# Patient Record
Sex: Male | Born: 1960 | Race: White | Hispanic: No | Marital: Married | Smoking: Never smoker
Health system: Southern US, Community
[De-identification: ages and names within clinical notes are randomized; demographics above are authoritative.]

## PROBLEM LIST (undated history)

## (undated) DIAGNOSIS — M109 Gout, unspecified: Secondary | ICD-10-CM

## (undated) DIAGNOSIS — M1990 Unspecified osteoarthritis, unspecified site: Secondary | ICD-10-CM

## (undated) DIAGNOSIS — I10 Essential (primary) hypertension: Secondary | ICD-10-CM

## (undated) HISTORY — PX: JOINT REPLACEMENT: SHX530

## (undated) HISTORY — PX: HERNIA REPAIR: SHX51

## (undated) HISTORY — PX: KNEE ARTHROSCOPY: SHX127

## (undated) HISTORY — PX: WISDOM TOOTH EXTRACTION: SHX21

---

## 2017-07-09 ENCOUNTER — Emergency Department (HOSPITAL_BASED_OUTPATIENT_CLINIC_OR_DEPARTMENT_OTHER)
Admission: EM | Admit: 2017-07-09 | Discharge: 2017-07-09 | Disposition: A | Discharge status: Home / Self Care | Payer: Managed Care, Other (non HMO) | Attending: Emergency Medicine | Admitting: Emergency Medicine

## 2017-07-09 ENCOUNTER — Emergency Department (HOSPITAL_BASED_OUTPATIENT_CLINIC_OR_DEPARTMENT_OTHER): Payer: Managed Care, Other (non HMO)

## 2017-07-09 ENCOUNTER — Encounter (HOSPITAL_BASED_OUTPATIENT_CLINIC_OR_DEPARTMENT_OTHER): Payer: Self-pay | Admitting: *Deleted

## 2017-07-09 ENCOUNTER — Other Ambulatory Visit: Payer: Self-pay

## 2017-07-09 DIAGNOSIS — S62652A Nondisplaced fracture of medial phalanx of right middle finger, initial encounter for closed fracture: Secondary | ICD-10-CM | POA: Insufficient documentation

## 2017-07-09 DIAGNOSIS — I10 Essential (primary) hypertension: Secondary | ICD-10-CM | POA: Insufficient documentation

## 2017-07-09 DIAGNOSIS — Y9389 Activity, other specified: Secondary | ICD-10-CM | POA: Insufficient documentation

## 2017-07-09 DIAGNOSIS — Y998 Other external cause status: Secondary | ICD-10-CM | POA: Diagnosis not present

## 2017-07-09 DIAGNOSIS — X509XXA Other and unspecified overexertion or strenuous movements or postures, initial encounter: Secondary | ICD-10-CM | POA: Insufficient documentation

## 2017-07-09 DIAGNOSIS — Z79899 Other long term (current) drug therapy: Secondary | ICD-10-CM | POA: Insufficient documentation

## 2017-07-09 DIAGNOSIS — S62654A Nondisplaced fracture of medial phalanx of right ring finger, initial encounter for closed fracture: Secondary | ICD-10-CM | POA: Insufficient documentation

## 2017-07-09 DIAGNOSIS — S6991XA Unspecified injury of right wrist, hand and finger(s), initial encounter: Secondary | ICD-10-CM | POA: Diagnosis present

## 2017-07-09 DIAGNOSIS — Y929 Unspecified place or not applicable: Secondary | ICD-10-CM | POA: Diagnosis not present

## 2017-07-09 HISTORY — DX: Gout, unspecified: M10.9

## 2017-07-09 HISTORY — DX: Essential (primary) hypertension: I10

## 2017-07-09 NOTE — ED Provider Notes (Signed)
[facility] EMERGENCY DEPARTMENT Provider Note   CSN: [id number] Arrival date & time: 07/09/17  4098     History   Chief Complaint Chief Complaint  Patient presents with  . Hand Injury    HPI Alan Carr is a 57 y.o. male.  Chief complaint is right hand injury.  HPI 57 year old male.  Right-handed.  He was attempting to stop his dog from chasing a child yesterday.  The dog was running at him.  Patient reached down and grabbed the dog's collar from the front with his right third and fourth digits.  They were hyperextended and "twisted".  He has some swelling and ecchymosis over his PIP joints of his middle and ring finger today.  Painful and swollen.  Past Medical History:  Diagnosis Date  . Gout   . Hypertension     There are no active problems to display for this patient.   Past Surgical History:  Procedure Laterality Date  . HERNIA REPAIR    . JOINT REPLACEMENT         Home Medications    Prior to Admission medications   Medication Sig Start Date End Date Taking? Authorizing Provider  allopurinol (ZYLOPRIM) 150 mg TABS tablet Take 300 mg by mouth daily.   Yes [provider]  atorvastatin (LIPITOR) 40 MG tablet Take 40 mg by mouth daily.   Yes [provider]  lisinopril (PRINIVIL,ZESTRIL) 20 MG tablet Take 20 mg by mouth daily.   Yes [provider]    Family History History reviewed. No pertinent family history.  Social History Social History   Tobacco Use  . Smoking status: Never Smoker  . Smokeless tobacco: Never Used  Substance Use Topics  . Alcohol use: No    Frequency: Never  . Drug use: No     Allergies   Aspirin   Review of Systems Review of Systems  Musculoskeletal:       Right hand pain.  Swelling and ecchymosis of right fourth and third digits.  No wrist pain.  Review of systems is otherwise complete, and negative   Physical Exam Updated Vital Signs BP (!) 182/102 (BP Location: Left Arm)    Pulse 74   Temp 98.3 F (36.8 C) (Oral)   Resp 16   Ht 5\' 8"  (1.727 m)   Wt 83.9 kg (185 lb)   SpO2 98%   BMI 28.13 kg/m   Physical Exam  Constitutional: He appears well-developed and well-nourished.  HENT:  Head: Atraumatic.  Musculoskeletal:  Tenderness with ecchymosis and soft tissue swelling of the right hand third and fourth digit dorsal over PIP.  He can flex although not completely due to swelling and discomfort.  No malrotation.  He can fully extend.  No limitation of tendon function.  Normal sensation.     ED Treatments / Results  Labs (all labs ordered are listed, but only abnormal results are displayed) Labs Reviewed - No data to display  EKG  EKG Interpretation None       Radiology Dg Hand Complete Right  Result Date: 07/09/2017 CLINICAL DATA:  57 year-old male was holding onto a dog by the collar last night when the dog pulled away twisting his hand. C/O pain to 3rd and 4th digits EXAM: RIGHT HAND - COMPLETE 3+ VIEW COMPARISON:  None. FINDINGS: Oblique nondisplaced fracture of midportion of the third middle phalanx. Nondisplaced transverse fracture of the distal shaft of the fourth middle phalanx. No other fracture or dislocation.  No aggressive  osseous lesion. IMPRESSION: Acute oblique nondisplaced fracture of the midportion of the third middle phalanx. Acute nondisplaced transverse fracture of the distal shaft of the fourth middle phalanx. Electronically Signed   By: Elige KoHetal  Patel   On: 07/09/2017 10:03    Procedures Procedures (including critical care time)  Medications Ordered in ED Medications - No data to display   Initial Impression / Assessment and Plan / ED Course  I have reviewed the triage vital signs and the nursing notes.  Pertinent labs & imaging results that were available during my care of the patient were reviewed by me and considered in my medical decision making (see chart for details).   X-ray pending.  10:10: Straight reviewed.  He  has fractures of third, and fourth digit middle phalanx.  The fourth is nondisplaced.  The middle finger on x-ray suggests slight rotational deformity.  However when patient is taken through range of motion finger does not rotate into malposition.  Placed in ulnar gutter splint in the safe position.  Will discharge to follow-up with Dr. Mina MarbleWeingold of hand surgery.  Ice, Motrin, Tylenol for pain.  Final Clinical Impressions(s) / ED Diagnoses   Final diagnoses:  Closed nondisplaced fracture of middle phalanx of right ring finger, initial encounter  Closed nondisplaced fracture of middle phalanx of right middle finger, initial encounter    ED Discharge Orders    None       Rolland PorterJames, Davone Shinault, MD 07/09/17 1017

## 2017-07-09 NOTE — ED Notes (Signed)
Patient transported to X-ray 

## 2017-07-09 NOTE — ED Notes (Signed)
ED Provider at bedside. 

## 2017-07-09 NOTE — Discharge Instructions (Signed)
Call Dr. Mina MarbleWeingold (Hand Surgeon) for outpatient follow-up. Where the splint until seen in follow up evaluation.

## 2017-07-09 NOTE — ED Triage Notes (Signed)
Pt c/o right hand injury x 1 day ago

## 2020-10-06 ENCOUNTER — Other Ambulatory Visit: Payer: Self-pay

## 2020-10-06 ENCOUNTER — Emergency Department (HOSPITAL_BASED_OUTPATIENT_CLINIC_OR_DEPARTMENT_OTHER): Payer: BC Managed Care – PPO

## 2020-10-06 ENCOUNTER — Encounter (HOSPITAL_BASED_OUTPATIENT_CLINIC_OR_DEPARTMENT_OTHER): Payer: Self-pay | Admitting: *Deleted

## 2020-10-06 ENCOUNTER — Emergency Department (HOSPITAL_BASED_OUTPATIENT_CLINIC_OR_DEPARTMENT_OTHER)
Admission: EM | Admit: 2020-10-06 | Discharge: 2020-10-06 | Disposition: A | Discharge status: Home / Self Care | Payer: BC Managed Care – PPO | Attending: Emergency Medicine | Admitting: Emergency Medicine

## 2020-10-06 DIAGNOSIS — S99912A Unspecified injury of left ankle, initial encounter: Secondary | ICD-10-CM | POA: Diagnosis present

## 2020-10-06 DIAGNOSIS — Z79899 Other long term (current) drug therapy: Secondary | ICD-10-CM | POA: Insufficient documentation

## 2020-10-06 DIAGNOSIS — Z9660 Presence of unspecified orthopedic joint implant: Secondary | ICD-10-CM | POA: Insufficient documentation

## 2020-10-06 DIAGNOSIS — W109XXA Fall (on) (from) unspecified stairs and steps, initial encounter: Secondary | ICD-10-CM | POA: Diagnosis not present

## 2020-10-06 DIAGNOSIS — S93492A Sprain of other ligament of left ankle, initial encounter: Secondary | ICD-10-CM

## 2020-10-06 DIAGNOSIS — I10 Essential (primary) hypertension: Secondary | ICD-10-CM | POA: Insufficient documentation

## 2020-10-06 DIAGNOSIS — S8391XA Sprain of unspecified site of right knee, initial encounter: Secondary | ICD-10-CM | POA: Insufficient documentation

## 2020-10-06 DIAGNOSIS — Y92009 Unspecified place in unspecified non-institutional (private) residence as the place of occurrence of the external cause: Secondary | ICD-10-CM | POA: Diagnosis not present

## 2020-10-06 DIAGNOSIS — Y9301 Activity, walking, marching and hiking: Secondary | ICD-10-CM | POA: Diagnosis not present

## 2020-10-06 DIAGNOSIS — R52 Pain, unspecified: Secondary | ICD-10-CM

## 2020-10-06 NOTE — ED Triage Notes (Signed)
Missed step last pm and rolled left ankle,bruising and swelling to foot,  fell, injured rt knee  Heard pop in knee

## 2020-10-06 NOTE — ED Provider Notes (Signed)
[facility] EMERGENCY DEPARTMENT Provider Note   CSN: [id number] Arrival date & time: 10/06/20  4975     History Chief Complaint  Patient presents with  . Fall    Alan Carr is a 60 y.o. male.  60 year old male with history of hypertension and gout who presents with fall and left ankle injury.  Last night, the patient was walking down his steps at home when he missed the last step and rolled his left ankle, falling and also injuring his right knee.  He thinks he heard a pop in his right knee.  He has been able to ambulate but with difficulty.  He has noted some swelling and bruising on the top of his foot.  No numbness.  No head injury or loss of consciousness.  No anticoagulant use.  The history is provided by the patient.  Fall       Past Medical History:  Diagnosis Date  . Gout   . Hypertension     There are no problems to display for this patient.   Past Surgical History:  Procedure Laterality Date  . HERNIA REPAIR    . JOINT REPLACEMENT         No family history on file.  Social History   Tobacco Use  . Smoking status: Never Smoker  . Smokeless tobacco: Never Used  Substance Use Topics  . Alcohol use: No  . Drug use: No    Home Medications Prior to Admission medications   Medication Sig Start Date End Date Taking? Authorizing Provider  allopurinol (ZYLOPRIM) 150 mg TABS tablet Take 300 mg by mouth daily.    [provider]  atorvastatin (LIPITOR) 40 MG tablet Take 40 mg by mouth daily.    [provider]  lisinopril (PRINIVIL,ZESTRIL) 20 MG tablet Take 20 mg by mouth daily.    [provider]    Allergies    Aspirin and Ibuprofen  Review of Systems   Review of Systems  Musculoskeletal: Positive for joint swelling.  Skin: Positive for color change.  Neurological: Negative for numbness.    Physical Exam Updated Vital Signs BP 125/84 (BP Location: Right Arm)   Pulse 72   Temp 98.4 F (36.9 C) (Oral)    Resp 18   Ht 5\' 8"  (1.727 m)   Wt 81.6 kg   SpO2 96%   BMI 27.37 kg/m   Physical Exam Vitals and nursing note reviewed.  Constitutional:      General: He is not in acute distress.    Appearance: He is well-developed.  HENT:     Head: Normocephalic and atraumatic.  Eyes:     Conjunctiva/sclera: Conjunctivae normal.  Cardiovascular:     Pulses: Normal pulses.  Musculoskeletal:        General: Swelling present.     Cervical back: Neck supple.     Comments: Mild edema and ecchymosis on lateral dorsal L foot w/ mild tenderness along ATFL; no gross deformity; no base of 5th metatarsal tenderness or midfoot instability; R knee with normal ROM, no obvious effusion  Skin:    General: Skin is warm and dry.  Neurological:     Mental Status: He is alert and oriented to person, place, and time.  Psychiatric:        Judgment: Judgment normal.     ED Results / Procedures / Treatments   Labs (all labs ordered are listed, but only abnormal results are displayed) Labs Reviewed - No data to display  EKG  None  Radiology DG Ankle Complete Left  Result Date: 10/06/2020 CLINICAL DATA:  Twisting injury 1 day ago with ankle pain, initial encounter EXAM: LEFT ANKLE COMPLETE - 3+ VIEW COMPARISON:  None. FINDINGS: No acute fracture or dislocation is noted. Multiple well corticated bony densities are noted adjacent to the medial malleolus consistent with prior trauma and nonunion. Similar findings are noted along the dorsal aspect of the tarsal navicular bone and talus. Small calcaneal spurs are noted. No other focal abnormality is seen. IMPRESSION: Changes of prior trauma with nonunion. No acute abnormality is noted. Electronically Signed   By: Alcide Clever M.D.   On: 10/06/2020 09:00   DG Knee Complete 4 Views Right  Result Date: 10/06/2020 CLINICAL DATA:  Twisting injury 1 day ago with knee pain, initial encounter EXAM: RIGHT KNEE - COMPLETE 4+ VIEW COMPARISON:  None. FINDINGS: No acute fracture  or dislocation is noted. No soft tissue abnormality is seen. Mild spurring from the patella is noted. IMPRESSION: No acute abnormality noted. Electronically Signed   By: Alcide Clever M.D.   On: 10/06/2020 09:05   DG Foot Complete Left  Result Date: 10/06/2020 CLINICAL DATA:  Twisting injury 1 day ago with persistent foot pain, initial encounter EXAM: LEFT FOOT - COMPLETE 3+ VIEW COMPARISON:  None. FINDINGS: No acute fracture or dislocation is noted. Well corticated bony density is noted along the superior aspect of the tarsal navicular bone consistent with prior trauma and nonunion. Small calcaneal spurs are noted. No soft tissue abnormality is seen. IMPRESSION: Changes of prior trauma.  No acute abnormality noted. Electronically Signed   By: Alcide Clever M.D.   On: 10/06/2020 08:56    Procedures Procedures   Medications Ordered in ED Medications - No data to display  ED Course  I have reviewed the triage vital signs and the nursing notes.  Pertinent imaging results that were available during my care of the patient were reviewed by me and considered in my medical decision making (see chart for details).    MDM Rules/Calculators/A&P                          Plain films of knee and ankle negative acute.  Suspect sprain.  Placed in ASO brace and discussed ports medicine follow-up as needed.  Reviewed supportive measures including ice, elevation, NSAIDs. Final Clinical Impression(s) / ED Diagnoses Final diagnoses:  Sprain of anterior talofibular ligament of left ankle, initial encounter  Sprain of right knee, unspecified ligament, initial encounter    Rx / DC Orders ED Discharge Orders    None       Asiah Befort, Ambrose Finland, MD 10/06/20 1554

## 2022-05-07 ENCOUNTER — Encounter: Payer: Self-pay | Admitting: Orthopedic Surgery

## 2022-05-07 ENCOUNTER — Ambulatory Visit (INDEPENDENT_AMBULATORY_CARE_PROVIDER_SITE_OTHER): Payer: BC Managed Care – PPO | Admitting: Orthopedic Surgery

## 2022-05-07 ENCOUNTER — Ambulatory Visit (INDEPENDENT_AMBULATORY_CARE_PROVIDER_SITE_OTHER): Payer: BC Managed Care – PPO

## 2022-05-07 DIAGNOSIS — M25511 Pain in right shoulder: Secondary | ICD-10-CM | POA: Diagnosis not present

## 2022-05-07 DIAGNOSIS — G8929 Other chronic pain: Secondary | ICD-10-CM

## 2022-05-07 NOTE — Progress Notes (Signed)
Office Visit Note   Patient: Alan Carr           Date of Birth: Aug 16, 1960           MRN: [id number] Visit Date: 05/07/2022 Requested by: Houston Siren., MD [address] [place],  [place] [address] PCP: Houston Siren., MD  Subjective: Chief Complaint  Patient presents with   Right Shoulder - Pain    HPI: Alan Carr is a 61 y.o. male who presents to the office reporting Alan Carr is a 61 year old patient with right shoulder pain.  He has had pain for years.  He has had an injection over a year ago which did not help much.  Describes decreased range of motion as well as pain which wakes him up every night several times.  Denies any neck pain or radicular symptoms.  Cannot take aspirin because he does have an allergy to aspirin.  Tylenol does not help.  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  Patient also has a history of gout but takes allopurinol and has not had a gout attack in many years..                ROS: All systems reviewed are negative as they relate to the chief complaint within the history of present illness.  Patient denies fevers or chills.  Assessment & Plan: Visit Diagnoses:  1. 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  He also has very limited external rotation.  He also has a history of no prior surgery on the shoulder.  Based on the severity of symptoms as well as lack of range of motion and glenoid deformity I think his best option will likely be reverse shoulder replacement.  He does not do too much overhead.  We will get a better idea about the amount of glenoid deformity present after we get the thin cut CT scan.  Plan at this time is to post for late January.  The risk and benefits of surgical treatment consisting of shoulder replacement are discussed.  They include but not limited to infection nerve and vessel damage instability incomplete  restoration of function and incomplete pain relief.  Patient understands risk benefits and wishes to proceed.  The rehabilitative process is also discussed.  All questions answered.  Follow-Up Instructions: No follow-ups on file.   Orders:  Orders Placed This Encounter  Procedures   XR Shoulder Right   CT SHOULDER RIGHT WO CONTRAST   No orders of the defined types were placed in this encounter.     Procedures: No procedures performed   Clinical Data: No additional findings.  Objective: Vital Signs: There were no vitals taken for this visit.  Physical Exam:  Constitutional: Patient appears well-developed HEENT:  Head: Normocephalic Eyes:EOM are normal Neck: Normal range of motion Cardiovascular: Normal rate Pulmonary/chest: Effort normal Neurologic: Patient is alert Skin: Skin is warm Psychiatric: Patient has normal mood and affect  Ortho Exam: Ortho exam demonstrates range of motion on the right of 5/60/90.  Range of motion on the left is 70/100/170.  Has pretty reasonable rotator cuff strength to subscap testing and external rotation strength bilaterally.  Supraspinatus strength little difficult to assess on the right because of lack of motion.  Deltoid is functional.  Motor or sensory function to the hand is intact.  Radial pulse intact.  Specialty Comments:  No specialty  comments available.  Imaging: XR Shoulder Right  Result Date: 05/07/2022 AP axillary outlet radiographs right shoulder reviewed.  End-stage arthritis is present.  Bone-on-bone changes noted.  Glenoid erosion posteriorly consistent with a type B2 glenoid is also present.  Inferior osteophyte noted.  Acromiohumeral distance maintained.  No acute fracture.  AC joint arthritis also present.    PMFS History: There are no problems to display for this patient.  Past Medical History:  Diagnosis Date   Gout    Hypertension     History reviewed. No pertinent family history.  Past Surgical History:   Procedure Laterality Date   HERNIA REPAIR     JOINT REPLACEMENT     Social History   Occupational History   Not on file  Tobacco Use   Smoking status: Never   Smokeless tobacco: Never  Substance and Sexual Activity   Alcohol use: No   Drug use: No   Sexual activity: Not on file

## 2022-05-11 ENCOUNTER — Ambulatory Visit
Admission: RE | Admit: 2022-05-11 | Discharge: 2022-05-11 | Disposition: A | Discharge status: Home / Self Care | Payer: BC Managed Care – PPO | Source: Ambulatory Visit | Attending: Orthopedic Surgery | Admitting: Orthopedic Surgery

## 2022-05-11 DIAGNOSIS — G8929 Other chronic pain: Secondary | ICD-10-CM

## 2022-05-24 ENCOUNTER — Ambulatory Visit: Payer: BC Managed Care – PPO | Admitting: Surgical

## 2022-05-24 DIAGNOSIS — M19011 Primary osteoarthritis, right shoulder: Secondary | ICD-10-CM | POA: Diagnosis not present

## 2022-06-02 ENCOUNTER — Encounter: Payer: Self-pay | Admitting: Surgical

## 2022-06-02 NOTE — Progress Notes (Signed)
Office Visit Note   Patient: Alan Carr           Date of Birth: [birth year]           MRN: [id number] Visit Date: 05/24/2022 Requested by: Lester Correll., MD [address] [place],  [place] [address] PCP: Lester [place]., MD  Subjective: Chief Complaint  Patient presents with   Other     Scan review    HPI: Alan Carr is a 61 y.o. male who presents to the office for review of right shoulder CT scan.  Denies any changes in his right shoulder pain.  Still continues to have restriction with his range of motion and nighttime pain that wakes him up every night.  No numbness or tingling or radicular pain,..                ROS: All systems reviewed are negative as they relate to the chief complaint within the history of present illness.  Patient denies fevers or chills.  Assessment & Plan: Visit Diagnoses: No diagnosis found.  Plan: Patient is a 61 year old male who presents for review of CT scan of the right shoulder.  CT scan demonstrates end-stage right shoulder arthritis.  He previously spoke with Dr. August Saucer about shoulder replacement and would like to proceed.  We discussed the risks and benefits of the procedure once again including the risk of nerve/blood vessel damage, shoulder stiffness, need for revision surgery in the future, infection, fracture during the procedure, medical complication from surgery such as MI/stroke/death.  We also discussed 25 pound lifting restriction after reverse shoulder arthroplasty.  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  There is options for desk work if necessary.  His preference is total shoulder arthroplasty to avoid as much of a lifting restriction.  We will plan to template for both anatomic shoulder placement and reverse shoulder replacement with decision point at the time of surgery to be made between either one.  Really depends on the excursion  of subscapularis and the appearance of the rotator cuff at the time of surgery.  Based on his limited external rotation, likely will have to go with reverse replacement which was discussed with him today.  He understands to proceed regardless.  Follow-up after procedure.  Follow-Up Instructions: No follow-ups on file.   Orders:  No orders of the defined types were placed in this encounter.  No orders of the defined types were placed in this encounter.     Procedures: No procedures performed   Clinical Data: No additional findings.  Objective: Vital Signs: There were no vitals taken for this visit.  Physical Exam:  Constitutional: Patient appears well-developed HEENT:  Head: Normocephalic Eyes:EOM are normal Neck: Normal range of motion Cardiovascular: Normal rate Pulmonary/chest: Effort normal Neurologic: Patient is alert Skin: Skin is warm Psychiatric: Patient has normal mood and affect  Ortho Exam: Ortho exam demonstrates right shoulder with 15 degrees external rotation, 50 degrees abduction, 110 degrees forward flexion.  Axillary nerve is intact with deltoid firing.  2+ radial pulse of the right upper extremity.  Excellent rotator cuff strength of supra, infra, subscap rated 5/5.  Coarseness noted with passive motion of the shoulder consistent with osteoarthritis.  Increased pain with passive motion of the shoulder.  No cellulitis or skin changes noted overlying the right shoulder  Specialty Comments:  No specialty comments available.  Imaging: No results  found.   PMFS History: There are no problems to display for this patient.  Past Medical History:  Diagnosis Date   Gout    Hypertension     No family history on file.  Past Surgical History:  Procedure Laterality Date   HERNIA REPAIR     JOINT REPLACEMENT     Social History   Occupational History   Not on file  Tobacco Use   Smoking status: Never   Smokeless tobacco: Never  Substance and Sexual  Activity   Alcohol use: No   Drug use: No   Sexual activity: Not on file

## 2022-07-05 NOTE — Pre-Procedure Instructions (Addendum)
Surgical Instructions    Your procedure is scheduled on Tuesday, February 6th.  Report to [facility] Main Entrance "A" at 05:30 A.M., then check in with the Admitting office.  Call this number if you have problems the morning of surgery:  [phone]  If you have any questions prior to your surgery date call [phone]: Open Monday-Friday 8am-4pm If you experience any cold or flu symptoms such as cough, fever, chills, shortness of breath, etc. between now and your scheduled surgery, please notify us at the above number.     Remember:  Do not eat after midnight the night before your surgery  You may drink clear liquids until 04:30 AM the morning of your surgery.   Clear liquids allowed are: Water, Non-Citrus Juices (without pulp), Carbonated Beverages, Clear Tea, Black Coffee Only (NO MILK, CREAM OR POWDERED CREAMER of any kind), and Gatorade.   Patient Instructions  The night before surgery:  No food after midnight. ONLY clear liquids after midnight  The day of surgery (if you do NOT have diabetes):  Drink ONE (1) Pre-Surgery Clear Ensure by 04:30 AM the morning of surgery. Drink in one sitting. Do not sip.  This drink was given to you during your hospital  pre-op appointment visit.  Nothing else to drink after completing the  Pre-Surgery Clear Ensure.          If you have questions, please contact your surgeon's office.     Take these medicines the morning of surgery with A SIP OF WATER  allopurinol (ZYLOPRIM)  atorvastatin (LIPITOR)  cetirizine (ZYRTEC)   If needed: tetrahydrozoline 0.05 % ophthalmic solution    As of today, STOP taking any Aspirin (unless otherwise instructed by your surgeon) Aleve, Naproxen, Ibuprofen, Motrin, Advil, Goody's, BC's, all herbal medications, fish oil, and all vitamins.                     Do NOT Smoke (Tobacco/Vaping) for 24 hours prior to your procedure.  If you use a CPAP at night, you may bring your mask/headgear for your  overnight stay.   Contacts, glasses, piercing's, hearing aid's, dentures or partials may not be worn into surgery, please bring cases for these belongings.    For patients admitted to the hospital, discharge time will be determined by your treatment team.   Patients discharged the day of surgery will not be allowed to drive home, and someone needs to stay with them for 24 hours.  SURGICAL WAITING ROOM VISITATION Patients having surgery or a procedure may have no more than 2 support people in the waiting area - these visitors may rotate.   Children under the age of 69 must have an adult with them who is not the patient. If the patient needs to stay at the hospital during part of their recovery, the visitor guidelines for inpatient rooms apply. Pre-op nurse will coordinate an appropriate time for 1 support person to accompany patient in pre-op.  This support person may not rotate.   Please refer to the [facility] website for the visitor guidelines for Inpatients (after your surgery is over and you are in a regular room).    Oral Hygiene is also important to reduce your risk of infection.  Remember - BRUSH YOUR TEETH THE MORNING OF SURGERY WITH YOUR REGULAR TOOTHPASTE  - Preparing for Total Shoulder Arthroplasty  Before surgery, you can play an important role. Because skin is not sterile, your skin needs to be as free of germs  as possible. You can reduce the number of germs on your skin by using the following products.   Benzoyl Peroxide Gel  o Reduces the number of germs present on the skin  o Applied twice a day to shoulder area starting two days before surgery   Chlorhexidine Gluconate (CHG) Soap (instructions listed above on how to wash with CHG Soap)  o An antiseptic cleaner that kills germs and bonds with the skin to continue killing germs even after washing  o Used for showering the night before surgery and morning of  surgery   ==================================================================  Please follow these instructions carefully:  BENZOYL PEROXIDE 5% GEL  Please do not use if you have an allergy to benzoyl peroxide. If your skin becomes reddened/irritated stop using the benzoyl peroxide.  Starting two days before surgery, apply as follows:  1. Apply benzoyl peroxide in the morning and at night. Apply after taking a shower. If you are not taking a shower clean entire shoulder front, back, and side along with the armpit with a clean wet washcloth.  2. Place a quarter-sized dollop on your SHOULDER and rub in thoroughly, making sure to cover the front, back, and side of your shoulder, along with the armpit.   2 Days prior to Surgery First Dose on _____________ Morning Second Dose on ______________ Night  Day Before Surgery First Dose on ______________ Morning Night before surgery wash (entire body except face and private areas) with CHG Soap THEN Second Dose on ____________ Night   Morning of Surgery  wash BODY AGAIN with CHG Soap   4. Do NOT apply benzoyl peroxide gel on the day of surgery   [place]- Preparing For Surgery  Before surgery, you can play an important role. Because skin is not sterile, your skin needs to be as free of germs as possible. You can reduce the number of germs on your skin by washing with CHG (chlorahexidine gluconate) Soap before surgery.  CHG is an antiseptic cleaner which kills germs and bonds with the skin to continue killing germs even after washing.     Please do not use if you have an allergy to CHG or antibacterial soaps. If your skin becomes reddened/irritated stop using the CHG.  Do not shave (including legs and underarms) for at least 48 hours prior to first CHG shower. It is OK to shave your face.  Please follow these instructions carefully.     Shower the NIGHT BEFORE SURGERY and the MORNING OF SURGERY with CHG Soap.   If you chose to wash  your hair, wash your hair first as usual with your normal shampoo. After you shampoo, rinse your hair and body thoroughly to remove the shampoo.  Then [employer] and genitals (private parts) with your normal soap and rinse thoroughly to remove soap.  After that Use CHG Soap as you would any other liquid soap. You can apply CHG directly to the skin and wash gently with a scrungie or a clean washcloth.   Apply the CHG Soap to your body ONLY FROM THE NECK DOWN.  Do not use on open wounds or open sores. Avoid contact with your eyes, ears, mouth and genitals (private parts). Wash Face and genitals (private parts)  with your normal soap.   Wash thoroughly, paying special attention to the area where your surgery will be performed.  Thoroughly rinse your body with warm water from the neck down.  DO NOT shower/wash with your normal soap after using and rinsing off the  CHG Soap.  Pat yourself dry with a CLEAN TOWEL.  8. Apply the Benzoyl Peroxide only the night before surgery.  Do Not use it the morning of surgery.  Wear CLEAN PAJAMAS to bed the night before surgery  Place CLEAN SHEETS on your bed the night before your surgery  DO NOT SLEEP WITH PETS.   Day of Surgery: Take a shower with CHG soap. Wear Clean/Comfortable clothing the morning of surgery Do not apply any deodorants/lotions.   Remember to brush your teeth WITH YOUR REGULAR TOOTHPASTE.   Please read over the following fact sheets that you were given.          If you received a COVID test during your pre-op visit  it is requested that you wear a mask when out in public, stay away from anyone that may not be feeling well and notify your surgeon if you develop symptoms. If you have been in contact with anyone that has tested positive in the last 10 days please notify you surgeon.

## 2022-07-06 ENCOUNTER — Encounter (HOSPITAL_COMMUNITY): Payer: Self-pay

## 2022-07-06 ENCOUNTER — Encounter (HOSPITAL_COMMUNITY)
Admission: RE | Admit: 2022-07-06 | Discharge: 2022-07-06 | Disposition: A | Discharge status: Home / Self Care | Payer: BC Managed Care – PPO | Source: Ambulatory Visit | Attending: Orthopedic Surgery | Admitting: Orthopedic Surgery

## 2022-07-06 ENCOUNTER — Other Ambulatory Visit: Payer: Self-pay

## 2022-07-06 VITALS — BP 142/89 | HR 76 | Temp 97.5°F | Resp 18 | Ht 68.0 in | Wt 190.2 lb

## 2022-07-06 DIAGNOSIS — Z01812 Encounter for preprocedural laboratory examination: Secondary | ICD-10-CM | POA: Diagnosis present

## 2022-07-06 DIAGNOSIS — Z01818 Encounter for other preprocedural examination: Secondary | ICD-10-CM

## 2022-07-06 HISTORY — DX: Unspecified osteoarthritis, unspecified site: M19.90

## 2022-07-06 LAB — CBC
HCT: 44.5 % (ref 39.0–52.0)
Hemoglobin: 15.6 g/dL (ref 13.0–17.0)
MCH: 33 pg (ref 26.0–34.0)
MCHC: 35.1 g/dL (ref 30.0–36.0)
MCV: 94.1 fL (ref 80.0–100.0)
Platelets: 209 10*3/uL (ref 150–400)
RBC: 4.73 MIL/uL (ref 4.22–5.81)
RDW: 12 % (ref 11.5–15.5)
WBC: 7 10*3/uL (ref 4.0–10.5)
nRBC: 0 % (ref 0.0–0.2)

## 2022-07-06 LAB — URINALYSIS, ROUTINE W REFLEX MICROSCOPIC
Bilirubin Urine: NEGATIVE
Glucose, UA: NEGATIVE mg/dL
Hgb urine dipstick: NEGATIVE
Ketones, ur: NEGATIVE mg/dL
Leukocytes,Ua: NEGATIVE
Nitrite: NEGATIVE
Protein, ur: NEGATIVE mg/dL
Specific Gravity, Urine: 1.004 — ABNORMAL LOW (ref 1.005–1.030)
pH: 6 (ref 5.0–8.0)

## 2022-07-06 LAB — BASIC METABOLIC PANEL
Anion gap: 12 (ref 5–15)
BUN: 10 mg/dL (ref 6–20)
CO2: 22 mmol/L (ref 22–32)
Calcium: 9.2 mg/dL (ref 8.9–10.3)
Chloride: 103 mmol/L (ref 98–111)
Creatinine, Ser: 0.75 mg/dL (ref 0.61–1.24)
GFR, Estimated: >60 mL/min (ref >60)
Glucose, Bld: 96 mg/dL (ref 70–99)
Potassium: 3.7 mmol/L (ref 3.5–5.1)
Sodium: 137 mmol/L (ref 135–145)

## 2022-07-06 LAB — SURGICAL PCR SCREEN
MRSA, PCR: NEGATIVE
Staphylococcus aureus: NEGATIVE

## 2022-07-06 NOTE — Progress Notes (Signed)
PCP - Dr. Gwenyth Allegra Cardiologist - denies  PPM/ICD - denies   Chest x-ray - 06/24/22 EKG - 06/24/22 Stress Test - 20+ years ago, normal per pt ECHO - denies Cardiac Cath - denies  Sleep Study - denies   DM- denies   ASA/Blood Thinner Instructions: n/a   ERAS Protcol - yes PRE-SURGERY Ensure given at PAT  COVID TEST- n/a   Anesthesia review: no  Patient denies shortness of breath, fever, cough and chest pain at PAT appointment   All instructions explained to the patient, with a verbal understanding of the material. Patient agrees to go over the instructions while at home for a better understanding. The opportunity to ask questions was provided.

## 2022-07-07 LAB — URINE CULTURE: Culture: NO GROWTH

## 2022-07-13 ENCOUNTER — Ambulatory Visit (HOSPITAL_COMMUNITY): Payer: BC Managed Care – PPO

## 2022-07-13 ENCOUNTER — Observation Stay (HOSPITAL_COMMUNITY)
Admission: RE | Admit: 2022-07-13 | Discharge: 2022-07-14 | Disposition: A | Discharge status: Home / Self Care | Payer: BC Managed Care – PPO | Attending: Orthopedic Surgery | Admitting: Orthopedic Surgery

## 2022-07-13 ENCOUNTER — Other Ambulatory Visit: Payer: Self-pay

## 2022-07-13 ENCOUNTER — Encounter (HOSPITAL_COMMUNITY): Payer: Self-pay | Admitting: Orthopedic Surgery

## 2022-07-13 ENCOUNTER — Encounter (HOSPITAL_COMMUNITY)
Admission: RE | Disposition: A | Discharge status: Home / Self Care | Payer: Self-pay | Source: Home / Self Care | Attending: Orthopedic Surgery

## 2022-07-13 ENCOUNTER — Ambulatory Visit (HOSPITAL_COMMUNITY): Payer: BC Managed Care – PPO | Admitting: Anesthesiology

## 2022-07-13 DIAGNOSIS — Z79899 Other long term (current) drug therapy: Secondary | ICD-10-CM | POA: Diagnosis not present

## 2022-07-13 DIAGNOSIS — Z96611 Presence of right artificial shoulder joint: Secondary | ICD-10-CM

## 2022-07-13 DIAGNOSIS — Z23 Encounter for immunization: Secondary | ICD-10-CM | POA: Insufficient documentation

## 2022-07-13 DIAGNOSIS — M7521 Bicipital tendinitis, right shoulder: Secondary | ICD-10-CM | POA: Diagnosis not present

## 2022-07-13 DIAGNOSIS — M19011 Primary osteoarthritis, right shoulder: Principal | ICD-10-CM | POA: Insufficient documentation

## 2022-07-13 DIAGNOSIS — Z01818 Encounter for other preprocedural examination: Secondary | ICD-10-CM

## 2022-07-13 HISTORY — PX: REVERSE SHOULDER ARTHROPLASTY: SHX5054

## 2022-07-13 SURGERY — ARTHROPLASTY, SHOULDER, TOTAL, REVERSE
Anesthesia: General | Site: Shoulder | Laterality: Right

## 2022-07-13 MED ORDER — BUPIVACAINE-EPINEPHRINE (PF) 0.5% -1:200000 IJ SOLN
INTRAMUSCULAR | Status: DC | PRN
  Administered 2022-07-13: 15 mL via PERINEURAL

## 2022-07-13 MED ORDER — PHENYLEPHRINE 80 MCG/ML (10ML) SYRINGE FOR IV PUSH (FOR BLOOD PRESSURE SUPPORT)
PREFILLED_SYRINGE | INTRAVENOUS | Status: DC | PRN
  Administered 2022-07-13 (×3): 160 ug via INTRAVENOUS
  Administered 2022-07-13 (×2): 80 ug via INTRAVENOUS

## 2022-07-13 MED ORDER — PHENYLEPHRINE HCL-NACL 20-0.9 MG/250ML-% IV SOLN
INTRAVENOUS | Status: DC | PRN
  Administered 2022-07-13: 30 ug/min via INTRAVENOUS

## 2022-07-13 MED ORDER — EPHEDRINE 5 MG/ML INJ
INTRAVENOUS | Status: AC
  Filled 2022-07-13: qty 5

## 2022-07-13 MED ORDER — CEFAZOLIN SODIUM-DEXTROSE 2-4 GM/100ML-% IV SOLN
2.0000 g | INTRAVENOUS | Status: AC
  Administered 2022-07-13 (×2): 2 g via INTRAVENOUS

## 2022-07-13 MED ORDER — DOCUSATE SODIUM 100 MG PO CAPS
100.0000 mg | ORAL_CAPSULE | Freq: Two times a day (BID) | ORAL | Status: DC
  Administered 2022-07-13 – 2022-07-14 (×3): 100 mg via ORAL
  Filled 2022-07-13 (×3): qty 1

## 2022-07-13 MED ORDER — BUPIVACAINE LIPOSOME 1.3 % IJ SUSP
INTRAMUSCULAR | Status: DC | PRN
  Administered 2022-07-13: 10 mL

## 2022-07-13 MED ORDER — SODIUM CHLORIDE 0.9 % IV SOLN: INTRAVENOUS | Status: DC

## 2022-07-13 MED ORDER — TRANEXAMIC ACID-NACL 1000-0.7 MG/100ML-% IV SOLN
INTRAVENOUS | Status: AC
  Filled 2022-07-13: qty 100

## 2022-07-13 MED ORDER — LACTATED RINGERS IV SOLN: INTRAVENOUS | Status: DC

## 2022-07-13 MED ORDER — ONDANSETRON HCL 4 MG PO TABS: 4.0000 mg | ORAL_TABLET | Freq: Four times a day (QID) | ORAL | Status: DC | PRN

## 2022-07-13 MED ORDER — ONDANSETRON HCL 4 MG/2ML IJ SOLN
INTRAMUSCULAR | Status: AC
  Filled 2022-07-13: qty 2

## 2022-07-13 MED ORDER — VANCOMYCIN HCL 1000 MG IV SOLR
INTRAVENOUS | Status: DC | PRN
  Administered 2022-07-13: 1000 mg via TOPICAL

## 2022-07-13 MED ORDER — CEFAZOLIN SODIUM 1 G IJ SOLR
INTRAMUSCULAR | Status: AC
  Filled 2022-07-13: qty 20

## 2022-07-13 MED ORDER — PHENOL 1.4 % MT LIQD: 1.0000 | OROMUCOSAL | Status: DC | PRN

## 2022-07-13 MED ORDER — HYDROMORPHONE HCL 1 MG/ML IJ SOLN
INTRAMUSCULAR | Status: AC
  Filled 2022-07-13: qty 0.5

## 2022-07-13 MED ORDER — CHLORHEXIDINE GLUCONATE 0.12 % MT SOLN
OROMUCOSAL | Status: AC
  Administered 2022-07-13: 15 mL via OROMUCOSAL
  Filled 2022-07-13: qty 15

## 2022-07-13 MED ORDER — ONDANSETRON HCL 4 MG/2ML IJ SOLN: 4.0000 mg | Freq: Four times a day (QID) | INTRAMUSCULAR | Status: DC | PRN

## 2022-07-13 MED ORDER — CEFAZOLIN SODIUM-DEXTROSE 2-4 GM/100ML-% IV SOLN
INTRAVENOUS | Status: AC
  Filled 2022-07-13: qty 100

## 2022-07-13 MED ORDER — INFLUENZA VAC SPLIT QUAD 0.5 ML IM SUSY
0.5000 mL | PREFILLED_SYRINGE | INTRAMUSCULAR | Status: AC
  Administered 2022-07-14: 0.5 mL via INTRAMUSCULAR
  Filled 2022-07-13: qty 0.5

## 2022-07-13 MED ORDER — ACETAMINOPHEN 500 MG PO TABS: 1000.0000 mg | ORAL_TABLET | Freq: Once | ORAL | Status: AC

## 2022-07-13 MED ORDER — FENTANYL CITRATE (PF) 100 MCG/2ML IJ SOLN: 25.0000 ug | INTRAMUSCULAR | Status: DC | PRN

## 2022-07-13 MED ORDER — ACETAMINOPHEN 325 MG PO TABS: 325.0000 mg | ORAL_TABLET | Freq: Four times a day (QID) | ORAL | Status: DC | PRN

## 2022-07-13 MED ORDER — OXYCODONE HCL 5 MG PO TABS
5.0000 mg | ORAL_TABLET | ORAL | Status: DC | PRN
  Administered 2022-07-13 – 2022-07-14 (×2): 5 mg via ORAL
  Filled 2022-07-13 (×2): qty 1

## 2022-07-13 MED ORDER — MENTHOL 3 MG MT LOZG: 1.0000 | LOZENGE | OROMUCOSAL | Status: DC | PRN

## 2022-07-13 MED ORDER — POVIDONE-IODINE 10 % EX SWAB
2.0000 | Freq: Once | CUTANEOUS | Status: AC
  Administered 2022-07-13: 2 via TOPICAL

## 2022-07-13 MED ORDER — SUGAMMADEX SODIUM 200 MG/2ML IV SOLN
INTRAVENOUS | Status: DC | PRN
  Administered 2022-07-13: 400 mg via INTRAVENOUS

## 2022-07-13 MED ORDER — PHENYLEPHRINE 80 MCG/ML (10ML) SYRINGE FOR IV PUSH (FOR BLOOD PRESSURE SUPPORT)
PREFILLED_SYRINGE | INTRAVENOUS | Status: AC
  Filled 2022-07-13: qty 10

## 2022-07-13 MED ORDER — MIDAZOLAM HCL 2 MG/2ML IJ SOLN
INTRAMUSCULAR | Status: AC
  Filled 2022-07-13: qty 2

## 2022-07-13 MED ORDER — ROCURONIUM BROMIDE 10 MG/ML (PF) SYRINGE
PREFILLED_SYRINGE | INTRAVENOUS | Status: AC
  Filled 2022-07-13: qty 10

## 2022-07-13 MED ORDER — AMISULPRIDE (ANTIEMETIC) 5 MG/2ML IV SOLN: 10.0000 mg | Freq: Once | INTRAVENOUS | Status: DC | PRN

## 2022-07-13 MED ORDER — DEXAMETHASONE SODIUM PHOSPHATE 10 MG/ML IJ SOLN
INTRAMUSCULAR | Status: DC | PRN
  Administered 2022-07-13: 10 mg via INTRAVENOUS

## 2022-07-13 MED ORDER — FENTANYL CITRATE (PF) 100 MCG/2ML IJ SOLN
INTRAMUSCULAR | Status: DC | PRN
  Administered 2022-07-13 (×2): 50 ug via INTRAVENOUS

## 2022-07-13 MED ORDER — VANCOMYCIN HCL 1000 MG IV SOLR
INTRAVENOUS | Status: AC
  Filled 2022-07-13: qty 20

## 2022-07-13 MED ORDER — POVIDONE-IODINE 7.5 % EX SOLN
Freq: Once | CUTANEOUS | Status: DC
  Filled 2022-07-13: qty 118

## 2022-07-13 MED ORDER — IRRISEPT - 450ML BOTTLE WITH 0.05% CHG IN STERILE WATER, USP 99.95% OPTIME
TOPICAL | Status: DC | PRN
  Administered 2022-07-13: 450 mL

## 2022-07-13 MED ORDER — ORAL CARE MOUTH RINSE: 15.0000 mL | Freq: Once | OROMUCOSAL | Status: AC

## 2022-07-13 MED ORDER — METHOCARBAMOL 500 MG PO TABS: 500.0000 mg | ORAL_TABLET | Freq: Four times a day (QID) | ORAL | Status: DC | PRN

## 2022-07-13 MED ORDER — ROCURONIUM BROMIDE 10 MG/ML (PF) SYRINGE
PREFILLED_SYRINGE | INTRAVENOUS | Status: DC | PRN
  Administered 2022-07-13: 60 mg via INTRAVENOUS

## 2022-07-13 MED ORDER — TRANEXAMIC ACID-NACL 1000-0.7 MG/100ML-% IV SOLN
1000.0000 mg | INTRAVENOUS | Status: AC
  Administered 2022-07-13: 1000 mg via INTRAVENOUS

## 2022-07-13 MED ORDER — CHLORHEXIDINE GLUCONATE 0.12 % MT SOLN: 15.0000 mL | Freq: Once | OROMUCOSAL | Status: AC

## 2022-07-13 MED ORDER — MIDAZOLAM HCL 2 MG/2ML IJ SOLN
INTRAMUSCULAR | Status: DC | PRN
  Administered 2022-07-13: 2 mg via INTRAVENOUS

## 2022-07-13 MED ORDER — LIDOCAINE 2% (20 MG/ML) 5 ML SYRINGE
INTRAMUSCULAR | Status: AC
  Filled 2022-07-13: qty 5

## 2022-07-13 MED ORDER — DEXAMETHASONE SODIUM PHOSPHATE 10 MG/ML IJ SOLN
INTRAMUSCULAR | Status: AC
  Filled 2022-07-13: qty 1

## 2022-07-13 MED ORDER — METOCLOPRAMIDE HCL 5 MG PO TABS: 5.0000 mg | ORAL_TABLET | Freq: Three times a day (TID) | ORAL | Status: DC | PRN

## 2022-07-13 MED ORDER — LIDOCAINE 2% (20 MG/ML) 5 ML SYRINGE
INTRAMUSCULAR | Status: DC | PRN
  Administered 2022-07-13: 20 mg via INTRAVENOUS

## 2022-07-13 MED ORDER — CEFAZOLIN SODIUM-DEXTROSE 2-4 GM/100ML-% IV SOLN
2.0000 g | Freq: Three times a day (TID) | INTRAVENOUS | Status: DC
  Administered 2022-07-13 – 2022-07-14 (×2): 2 g via INTRAVENOUS
  Filled 2022-07-13 (×2): qty 100

## 2022-07-13 MED ORDER — SODIUM CHLORIDE (PF) 0.9 % IJ SOLN
INTRAMUSCULAR | Status: AC
  Filled 2022-07-13: qty 20

## 2022-07-13 MED ORDER — METOCLOPRAMIDE HCL 5 MG/ML IJ SOLN: 5.0000 mg | Freq: Three times a day (TID) | INTRAMUSCULAR | Status: DC | PRN

## 2022-07-13 MED ORDER — HYDROMORPHONE HCL 1 MG/ML IJ SOLN: 0.5000 mg | INTRAMUSCULAR | Status: DC | PRN

## 2022-07-13 MED ORDER — LISINOPRIL 20 MG PO TABS
20.0000 mg | ORAL_TABLET | Freq: Every day | ORAL | Status: DC
  Administered 2022-07-14: 20 mg via ORAL
  Filled 2022-07-13: qty 1

## 2022-07-13 MED ORDER — PROPOFOL 10 MG/ML IV BOLUS
INTRAVENOUS | Status: DC | PRN
  Administered 2022-07-13: 200 mg via INTRAVENOUS

## 2022-07-13 MED ORDER — ACETAMINOPHEN 500 MG PO TABS
ORAL_TABLET | ORAL | Status: AC
  Administered 2022-07-13: 1000 mg via ORAL
  Filled 2022-07-13: qty 2

## 2022-07-13 MED ORDER — HYDROMORPHONE HCL 1 MG/ML IJ SOLN
INTRAMUSCULAR | Status: DC | PRN
  Administered 2022-07-13 (×2): .25 mg via INTRAVENOUS

## 2022-07-13 MED ORDER — METHOCARBAMOL 1000 MG/10ML IJ SOLN: 500.0000 mg | Freq: Four times a day (QID) | INTRAVENOUS | Status: DC | PRN

## 2022-07-13 MED ORDER — ONDANSETRON HCL 4 MG/2ML IJ SOLN
INTRAMUSCULAR | Status: DC | PRN
  Administered 2022-07-13: 4 mg via INTRAVENOUS

## 2022-07-13 MED ORDER — ACETAMINOPHEN 500 MG PO TABS
1000.0000 mg | ORAL_TABLET | Freq: Four times a day (QID) | ORAL | Status: DC
  Administered 2022-07-13 – 2022-07-14 (×3): 1000 mg via ORAL
  Filled 2022-07-13 (×3): qty 2

## 2022-07-13 MED ORDER — FENTANYL CITRATE (PF) 100 MCG/2ML IJ SOLN
INTRAMUSCULAR | Status: AC
  Filled 2022-07-13: qty 2

## 2022-07-13 MED ORDER — 0.9 % SODIUM CHLORIDE (POUR BTL) OPTIME
TOPICAL | Status: DC | PRN
  Administered 2022-07-13: 4000 mL

## 2022-07-13 MED ORDER — PROPOFOL 10 MG/ML IV BOLUS
INTRAVENOUS | Status: AC
  Filled 2022-07-13: qty 20

## 2022-07-13 SURGICAL SUPPLY — 86 items
ALCOHOL 70% 16 OZ (MISCELLANEOUS) ×1 IMPLANT
AUG COMP REV MI TAPER ADAPTER (Joint) ×1 IMPLANT
AUGMENT COMP REV MI TAPR ADPTR (Joint) IMPLANT
BAG COUNTER SPONGE SURGICOUNT (BAG) ×1 IMPLANT
BEARING HUMERAL 40 STD VITE (Joint) IMPLANT
BIT DRILL 2.7 W/STOP DISP (BIT) IMPLANT
BIT DRILL TWIST 2.7 (BIT) IMPLANT
BLADE SAW SGTL 13X75X1.27 (BLADE) ×1 IMPLANT
CHLORAPREP W/TINT 26 (MISCELLANEOUS) ×1 IMPLANT
COOLER ICEMAN CLASSIC (MISCELLANEOUS) ×1 IMPLANT
COVER SURGICAL LIGHT HANDLE (MISCELLANEOUS) ×1 IMPLANT
DRAPE INCISE IOBAN 66X45 STRL (DRAPES) ×1 IMPLANT
DRAPE U-SHAPE 47X51 STRL (DRAPES) ×2 IMPLANT
DRSG AQUACEL AG ADV 3.5X10 (GAUZE/BANDAGES/DRESSINGS) ×1 IMPLANT
ELECT BLADE 4.0 EZ CLEAN MEGAD (MISCELLANEOUS) ×1
ELECT REM PT RETURN 9FT ADLT (ELECTROSURGICAL) ×1
ELECTRODE BLDE 4.0 EZ CLN MEGD (MISCELLANEOUS) ×1 IMPLANT
ELECTRODE REM PT RTRN 9FT ADLT (ELECTROSURGICAL) ×1 IMPLANT
GAUZE SPONGE 4X4 12PLY STRL LF (GAUZE/BANDAGES/DRESSINGS) ×1 IMPLANT
GLENOSPHERE VERSADIAL 40 +3 (Joint) IMPLANT
GLOVE BIOGEL PI IND STRL 7.0 (GLOVE) ×1 IMPLANT
GLOVE BIOGEL PI IND STRL 8 (GLOVE) ×1 IMPLANT
GLOVE ECLIPSE 7.0 STRL STRAW (GLOVE) ×1 IMPLANT
GLOVE ECLIPSE 8.0 STRL XLNG CF (GLOVE) ×1 IMPLANT
GOWN STRL REUS W/ TWL LRG LVL3 (GOWN DISPOSABLE) ×1 IMPLANT
GOWN STRL REUS W/ TWL XL LVL3 (GOWN DISPOSABLE) ×1 IMPLANT
GOWN STRL REUS W/TWL LRG LVL3 (GOWN DISPOSABLE) ×1
GOWN STRL REUS W/TWL XL LVL3 (GOWN DISPOSABLE) ×1
GUIDE GLENOID W/BONE MODEL RT (ORTHOPEDIC DISPOSABLE SUPPLIES) IMPLANT
GUIDE MODEL REV SHLD RT (ORTHOPEDIC DISPOSABLE SUPPLIES) IMPLANT
HDLS TROCR DRIL PIN KNEE 75 (PIN) ×1
HYDROGEN PEROXIDE 16OZ (MISCELLANEOUS) ×1 IMPLANT
JET LAVAGE IRRISEPT WOUND (IRRIGATION / IRRIGATOR) ×1
KIT BASIN OR (CUSTOM PROCEDURE TRAY) ×1 IMPLANT
KIT TURNOVER KIT B (KITS) ×1 IMPLANT
LAVAGE JET IRRISEPT WOUND (IRRIGATION / IRRIGATOR) ×1 IMPLANT
LOOP VESSEL MAXI BLUE (MISCELLANEOUS) ×1 IMPLANT
MANIFOLD NEPTUNE II (INSTRUMENTS) ×1 IMPLANT
NDL SUT 6 .5 CRC .975X.05 MAYO (NEEDLE) IMPLANT
NDL TAPERED W/ NITINOL LOOP (MISCELLANEOUS) ×1 IMPLANT
NEEDLE MAYO TAPER (NEEDLE) ×1
NEEDLE TAPERED W/ NITINOL LOOP (MISCELLANEOUS) IMPLANT
NS IRRIG 1000ML POUR BTL (IV SOLUTION) ×1 IMPLANT
PACK SHOULDER (CUSTOM PROCEDURE TRAY) ×1 IMPLANT
PAD ARMBOARD 7.5X6 YLW CONV (MISCELLANEOUS) ×2 IMPLANT
PAD COLD SHLDR WRAP-ON (PAD) ×1 IMPLANT
PASSER SUT SWANSON 36MM LOOP (INSTRUMENTS) ×1 IMPLANT
PIN DRILL HDLS TROCAR 75 4PK (PIN) IMPLANT
PIN HUMERAL STMN 3.2MMX9IN (INSTRUMENTS) IMPLANT
REAMER AUG COMP MINI 10 DISP (ORTHOPEDIC DISPOSABLE SUPPLIES) IMPLANT
REAMER GUIDE W/SCREW AUG (MISCELLANEOUS) IMPLANT
RESTRAINT HEAD UNIVERSAL NS (MISCELLANEOUS) ×1 IMPLANT
RETRIEVER SUT HEWSON (MISCELLANEOUS) IMPLANT
SCREW BONE LOCKING 4.75X35X3.5 (Screw) IMPLANT
SCREW BONE STRL 6.5MMX25MM (Screw) IMPLANT
SCREW LOCKING 4.75MMX15MM (Screw) IMPLANT
SCREW LOCKING NS 4.75MMX20MM (Screw) IMPLANT
SCREW LOCKING STRL 4.75X25X3.5 (Screw) IMPLANT
SLING ARM FOAM STRAP XLG (SOFTGOODS) IMPLANT
SLING ARM IMMOBILIZER LRG (SOFTGOODS) ×1 IMPLANT
SLING ARM IMMOBILIZER XL (CAST SUPPLIES) IMPLANT
SOL PREP POV-IOD 4OZ 10% (MISCELLANEOUS) ×1 IMPLANT
SPONGE T-LAP 18X18 ~~LOC~~+RFID (SPONGE) ×1 IMPLANT
STEM MINI LONG 15MM 83MM (Stem) IMPLANT
STRIP CLOSURE SKIN 1/2X4 (GAUZE/BANDAGES/DRESSINGS) ×1 IMPLANT
SUCTION FRAZIER HANDLE 10FR (MISCELLANEOUS) ×1
SUCTION TUBE FRAZIER 10FR DISP (MISCELLANEOUS) ×1 IMPLANT
SUT BROADBAND TAPE 2PK 1.5 (SUTURE) IMPLANT
SUT FIBERWIRE #2 38 T-5 BLUE (SUTURE)
SUT MAXBRAID (SUTURE) IMPLANT
SUT MNCRL AB 3-0 PS2 18 (SUTURE) ×1 IMPLANT
SUT SILK 2 0 TIES 10X30 (SUTURE) ×1 IMPLANT
SUT VIC AB 0 CT1 27 (SUTURE) ×4
SUT VIC AB 0 CT1 27XBRD ANBCTR (SUTURE) ×4 IMPLANT
SUT VIC AB 1 CT1 27 (SUTURE) ×4
SUT VIC AB 1 CT1 27XBRD ANBCTR (SUTURE) ×2 IMPLANT
SUT VIC AB 2-0 CT1 27 (SUTURE) ×3
SUT VIC AB 2-0 CT1 TAPERPNT 27 (SUTURE) ×3 IMPLANT
SUT VICRYL 0 UR6 27IN ABS (SUTURE) ×2 IMPLANT
SUTURE FIBERWR #2 38 T-5 BLUE (SUTURE) IMPLANT
TOWEL GREEN STERILE (TOWEL DISPOSABLE) ×1 IMPLANT
TRAY CATH INTERMITTENT SS 16FR (CATHETERS) IMPLANT
TRAY FOL W/BAG SLVR 16FR STRL (SET/KITS/TRAYS/PACK) IMPLANT
TRAY FOLEY W/BAG SLVR 16FR LF (SET/KITS/TRAYS/PACK)
TRAY HUM REV SHOULDER STD +6 (Shoulder) IMPLANT
WATER STERILE IRR 1000ML POUR (IV SOLUTION) ×1 IMPLANT

## 2022-07-13 NOTE — H&P (Signed)
Alan Carr is an 62 y.o. male.   Chief Complaint: Right shoulder pain HPI:  Alan Carr is a 62 y.o. male who presents  with right shoulder pain.  He has had pain for years.  He has had an injection over a year ago which did not help much.  Describes decreased range of motion as well as pain which wakes him up every night several times.  Denies any neck pain or radicular symptoms.  Cannot take aspirin because he does have an allergy to aspirin.  Tylenol does not help.  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  Patient also has a history of gout but takes allopurinol and has not had a gout attack in many years..     Past Medical History:  Diagnosis Date   Arthritis    right shoulder   Gout    Hypertension     Past Surgical History:  Procedure Laterality Date   HERNIA REPAIR Right    inguinal   KNEE ARTHROSCOPY Left    meniscus repair   WISDOM TOOTH EXTRACTION      History reviewed. No pertinent family history. Social History:  reports that he has never smoked. He has never used smokeless tobacco. He reports current alcohol use of about 2.0 - 4.0 standard drinks of alcohol per week. He reports that he does not use drugs.  Allergies:  Allergies  Allergen Reactions   Aspirin Anaphylaxis   Ibuprofen Swelling    Medications Prior to Admission  Medication Sig Dispense Refill   allopurinol (ZYLOPRIM) 150 mg TABS tablet Take 300 mg by mouth daily.     atorvastatin (LIPITOR) 40 MG tablet Take 40 mg by mouth daily.     cetirizine (ZYRTEC) 10 MG tablet Take 10 mg by mouth daily.     lisinopril (PRINIVIL,ZESTRIL) 20 MG tablet Take 20 mg by mouth daily.     tetrahydrozoline 0.05 % ophthalmic solution Place 1 drop into both eyes daily as needed (dry/irritated eyes).     zolpidem (AMBIEN) 10 MG tablet Take 10 mg by mouth at bedtime as needed for sleep.      No results found for this or any previous visit (from the past 48 hour(s)). No results found.  Review of  Systems  Musculoskeletal:  Positive for arthralgias.  All other systems reviewed and are negative.   Blood pressure 139/89, pulse 82, temperature 98.4 F (36.9 C), temperature source Oral, resp. rate 20, height 5\' 8"  (1.727 m), weight 83.9 kg, SpO2 96 %. Physical Exam Vitals reviewed.  HENT:     Head: Normocephalic.     Nose: Nose normal.     Mouth/Throat:     Mouth: Mucous membranes are moist.  Eyes:     Pupils: Pupils are equal, round, and reactive to light.  Cardiovascular:     Rate and Rhythm: Normal rate.     Pulses: Normal pulses.  Pulmonary:     Effort: Pulmonary effort is normal.  Abdominal:     General: Abdomen is flat.  Musculoskeletal:     Cervical back: Normal range of motion.  Skin:    General: Skin is warm.     Capillary Refill: Capillary refill takes less than 2 seconds.  Neurological:     General: No focal deficit present.     Mental Status: He is alert.  Psychiatric:        Mood and Affect: Mood normal.    Ortho exam demonstrates range of motion on  the right of 5/60/90. Range of motion on the left is 70/100/170. Has pretty reasonable rotator cuff strength to subscap testing and external rotation strength bilaterally. Supraspinatus strength little difficult to assess on the right because of lack of motion. Deltoid is functional. Motor or sensory function to the hand is intact. Radial pulse intact   Assessment/Plan  Impression is right shoulder arthritis.  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  He also has very limited external rotation.  He also has a history of no prior surgery on the shoulder.  Based on the severity of symptoms as well as lack of range of motion and glenoid deformity I think his best option will likely be reverse shoulder replacement.  He does not do too much overhead.  We will get a better idea about the amount of glenoid deformity present after we get the thin cut CT scan.  Plan at this time is to post for late January.  The  risk and benefits of surgical treatment consisting of shoulder replacement are discussed.  They include but not limited to infection nerve and vessel damage instability incomplete restoration of function and incomplete pain relief.  Patient understands risk benefits and wishes to proceed.  The rehabilitative process is also discussed.  All questions answered.    Anderson Malta, MD 07/13/2022, 7:23 AM

## 2022-07-13 NOTE — Transfer of Care (Signed)
Immediate Anesthesia Transfer of Care Note  Patient: Alan Carr  Procedure(s) Performed: RIGHT REVERSE SHOULDER ARTHROPLASTY (Right: Shoulder)  Patient Location: PACU  Anesthesia Type:GA combined with regional for post-op pain  Level of Consciousness: sedated and patient cooperative  Airway & Oxygen Therapy: Patient connected to face mask oxygen  Post-op Assessment: Report given to RN and Post -op Vital signs reviewed and stable  Post vital signs: Reviewed and stable  Last Vitals:  Vitals Value Taken Time  BP 127/78 07/13/22 1155  Temp    Pulse 82 07/13/22 1159  Resp 16 07/13/22 1159  SpO2 94 % 07/13/22 1159  Vitals shown include unvalidated device data.  Last Pain:  Vitals:   07/13/22 0556  TempSrc:   PainSc: 6       Patients Stated Pain Goal: 0 (83/66/29 4765)  Complications: No notable events documented.

## 2022-07-13 NOTE — Anesthesia Procedure Notes (Signed)
Procedure Name: Intubation Date/Time: 07/13/2022 7:50 AM  Performed by: Lind Guest, CRNAPre-anesthesia Checklist: Patient identified, Emergency Drugs available, Suction available, Patient being monitored and Timeout performed Patient Re-evaluated:Patient Re-evaluated prior to induction Oxygen Delivery Method: Circle system utilized Preoxygenation: Pre-oxygenation with 100% oxygen Induction Type: IV induction Ventilation: Mask ventilation without difficulty Laryngoscope Size: Mac and 4 Grade View: Grade I Tube type: Oral Tube size: 7.5 mm Number of attempts: 1 Placement Confirmation: ETT inserted through vocal cords under direct vision, positive ETCO2 and breath sounds checked- equal and bilateral Secured at: 22 cm Tube secured with: Tape Dental Injury: Teeth and Oropharynx as per pre-operative assessment

## 2022-07-13 NOTE — Brief Op Note (Signed)
   07/13/2022  12:15 PM  PATIENT:  Alan Carr  62 y.o. male  PRE-OPERATIVE DIAGNOSIS:  right shoulder osteoarthritis, biceps tendinitis  POST-OPERATIVE DIAGNOSIS:  right shoulder osteoarthritis, biceps tendinitis  PROCEDURE:  Procedure(s): RIGHT REVERSE SHOULDER ARTHROPLASTY, biceps tenodesis  SURGEON:  Surgeon(s): Marlou Sa, Tonna Corner, MD  ASSISTANT: Annie Main  ANESTHESIA:   general  EBL: 100 ml    Total I/O In: 1400 [I.V.:1400] Out: 425 [Urine:325; Blood:100]  BLOOD ADMINISTERED: none  DRAINS: none   LOCAL MEDICATIONS USED:  none  SPECIMEN:  No Specimen  COUNTS:  YES  TOURNIQUET:  * No tourniquets in log *  DICTATION: .Other Dictation: Dictation Number [id number]  PLAN OF CARE: Admit for overnight observation  PATIENT DISPOSITION:  PACU - hemodynamically stable

## 2022-07-13 NOTE — Anesthesia Procedure Notes (Signed)
Anesthesia Regional Block: Interscalene brachial plexus block   Pre-Anesthetic Checklist: , timeout performed,  Correct Patient, Correct Site, Correct Laterality,  Correct Procedure, Correct Position, site marked,  Risks and benefits discussed,  Surgical consent,  Pre-op evaluation,  At surgeon's request and post-op pain management  Laterality: Right  Prep: chloraprep       Needles:  Injection technique: Single-shot  Needle Type: Echogenic Stimulator Needle     Needle Length: 9cm  Needle Gauge: 21     Additional Needles:   Procedures:, nerve stimulator,,, ultrasound used (permanent image in chart),,     Nerve Stimulator or Paresthesia:  Response: deltoid and bicep, 0.5 mA  Additional Responses:   Narrative:  Start time: 07/13/2022 6:58 AM End time: 07/13/2022 7:04 AM Injection made incrementally with aspirations every 5 mL.  Performed by: Personally  Anesthesiologist: Suzette Battiest, MD

## 2022-07-13 NOTE — Op Note (Signed)
NAMEFODAY, BRADEN MEDICAL RECORD NO: [id number] ACCOUNT NO: [id number] DATE OF BIRTH: 03-27-1961 FACILITY: [facility] LOCATION: [facility] PHYSICIAN: Yetta Barre. Marlou Sa, MD  Operative Report   DATE OF PROCEDURE: 07/13/2022  PREOPERATIVE DIAGNOSIS:  Right shoulder arthritis and biceps tendinitis.  POSTOPERATIVE DIAGNOSIS:  Right shoulder arthritis and biceps tendinitis.  PROCEDURE:  Right reverse shoulder replacement using [employer] components, small augmented baseplate with 40 mm +3 glenosphere, size 15 mini humeral stem with +6 offset humeral tray and standard 36 mm bearing.,biceps tenodesis  SURGEON:  Yetta Barre. Marlou Sa, MD  ASSISTANT:  Annie Main, PA.  INDICATIONS:  The patient is a 62 year old patient with right shoulder pain, who presents for operative management after explanation of risks and benefits.  DESCRIPTION OF PROCEDURE:  The patient was brought to the operating room where general endotracheal anesthesia was induced.  Preoperative antibiotics administered.  Timeout was called.  Right shoulder, arm and hand prescrubbed with hydrogen peroxide  followed by alcohol and Betadine, which was allowed to air dry, then prepped with ChloraPrep solution and draped in sterile manner.  Ioban used to seal the operative field and cover the axilla and then cover the operative field.  Timeout was called.   Deltopectoral approach was made.  IrriSept solution utilized.  Deltopectoral interval was developed and the cephalic vein was mobilized laterally.  The patient had very restricted preoperative range of motion to about 5 degrees of external rotation, only  about 30 degrees of isolated glenohumeral abduction and about 80 degrees of forward flexion.  The subdeltoid and subacromial adhesions were released.  A Kolbel retractor was placed.  The deltoid was elevated off its anterior attachment manually.  Biceps  tendon was then tenodesed to the pec tendon, which was then released about 1.5-2 cm.  This was done  with 5-0 Vicryl sutures.  Next, the biceps tendon was used to enter into the glenohumeral joint by opening up the rotator interval.  Tagged with #1  Vicryl suture.  Circumflex vessels were then ligated.  Axillary nerve was visualized and palpated and protected at all times during the case.  The subscap was then detached from the lesser tuberosity using a 15 blade.  Completed with cautery around the  inferior humeral neck, large osteophyte was present.  Using a combination of sharp dissection and blunt dissection with a Cobb elevator, the capsule was released around to the 5 o'clock position.  Osteophyte was removed.  Next, the head was dislocated.   A circumferential release of the subscap was performed with care being taken to avoid injury to the axillary nerve.  We only obtained about 1.5 cm more of excursion with release of both the coracohumeral ligament and circumferential release of the  subscap.  The other rotator cuff tendons had some degeneration, but they were intact including the supraspinatus, and infraspinatus.  The head was dislocated.  Proximal reaming was then performed up to a size 15.  Head was cut in 30 degrees of  retroversion.  Broaching then performed up to a size 15 and a cap was placed.  Next, posterior retractors were placed.  A patient-specific instrumentation was placed after circumferentially excising the labrum.  A Bankart lesion was created from the 12  o'clock to 6 o'clock position. The patient-specific guide was placed.  Next, the glenoid was reamed to appropriate depth.  Posterior reaming was then performed.  A baseplate was placed with a small augment, which had excellent purchase with 1 central  peripheral compression screw and four peripheral locking screws.  Next, trial reduction was performed with a 40 mm glenosphere and +3 offset.  This gave the best stability with the +6 humeral offset and standard tray.  At this time, the trial glenosphere  was removed.  Trial stem  was removed.  The drill holes were placed and a suture tapes placed through the lesser tuberosity for later reattachment of the subscap.  The glenosphere was then placed in good position and alignment with the augment moving  slightly posterior and inferior.  Next, the true stem was placed after irrigating the canal with IrriSept solution and placing vancomycin powder.  Next stem was placed and the patient had excellent stability with the +6 offset tray and the standard  liner.  The patient had very good stability to extension and abduction.  Also, very good range of motion with external rotation, forward flexion and abduction both much improved over her preoperative status.  His forward flexion and abduction both above  90.  Next, the true components were placed on the humeral side.  Same stability parameters were maintained.  Thorough irrigation was performed with 3 liters of irrigating solution.  Next subscap was closed using Nice knots.  This was done with the arm in  30 degrees of external rotation.  Rotator interval was also closed using #1 Vicryl suture, but before we did that, we irrigated with IrriSept and placed vancomycin powder directly on the prosthesis.  Next, with the rotator interval closed, we irrigated  again, palpated the axillary nerve, which was intact and then closed the deltopectoral interval using #1 Vicryl suture.  Skin was closed using 0 Vicryl suture, 2-0 Vicryl suture, and 3-0 Monocryl with Steri-Strips and Aquacel dressing applied.  The  patient tolerated the procedure well without immediate complications.  Shoulder sling applied.  Luke's assistance was required at all times for retraction, opening, closing, mobilization of tissue.  His assistance was a medical necessity.   PUS D: 07/13/2022 12:24:01 pm T: 07/13/2022 12:50:00 pm  JOB: [id number]

## 2022-07-13 NOTE — Anesthesia Preprocedure Evaluation (Signed)
Anesthesia Evaluation  Patient identified by MRN, date of birth, ID band Patient awake    Reviewed: Allergy & Precautions, NPO status , Patient's Chart, lab work & pertinent test results  Airway Mallampati: II  TM Distance: >3 FB Neck ROM: Full    Dental  (+) Dental Advisory Given   Pulmonary neg pulmonary ROS   breath sounds clear to auscultation       Cardiovascular hypertension, Pt. on medications  Rhythm:Regular Rate:Normal     Neuro/Psych negative neurological ROS     GI/Hepatic negative GI ROS, Neg liver ROS,,,  Endo/Other  negative endocrine ROS    Renal/GU negative Renal ROS     Musculoskeletal  (+) Arthritis ,    Abdominal   Peds  Hematology negative hematology ROS (+)   Anesthesia Other Findings   Reproductive/Obstetrics                             Anesthesia Physical Anesthesia Plan  ASA: 2  Anesthesia Plan: General   Post-op Pain Management: Regional block* and Tylenol PO (pre-op)*   Induction: Intravenous  PONV Risk Score and Plan: 2 and Dexamethasone, Ondansetron and Treatment may vary due to age or medical condition  Airway Management Planned: Oral ETT  Additional Equipment: None  Intra-op Plan:   Post-operative Plan: Extubation in OR  Informed Consent: I have reviewed the patients History and Physical, chart, labs and discussed the procedure including the risks, benefits and alternatives for the proposed anesthesia with the patient or authorized representative who has indicated his/her understanding and acceptance.     Dental advisory given  Plan Discussed with:   Anesthesia Plan Comments:        Anesthesia Quick Evaluation

## 2022-07-13 NOTE — Anesthesia Postprocedure Evaluation (Signed)
Anesthesia Post Note  Patient: Alan Carr  Procedure(s) Performed: RIGHT REVERSE SHOULDER ARTHROPLASTY (Right: Shoulder)     Patient location during evaluation: PACU Anesthesia Type: General Level of consciousness: awake and alert Pain management: pain level controlled Vital Signs Assessment: post-procedure vital signs reviewed and stable Respiratory status: spontaneous breathing, nonlabored ventilation, respiratory function stable and patient connected to nasal cannula oxygen Cardiovascular status: blood pressure returned to baseline and stable Postop Assessment: no apparent nausea or vomiting Anesthetic complications: no   No notable events documented.  Last Vitals:  Vitals:   07/13/22 1245 07/13/22 1300  BP: 124/79 123/83  Pulse: 74 72  Resp: 16 13  Temp:    SpO2: 96% 93%    Last Pain:  Vitals:   07/13/22 1156  TempSrc:   PainSc: [name]

## 2022-07-13 NOTE — Evaluation (Signed)
Occupational Therapy Evaluation Patient Details Name: Alan Carr MRN: [id number] DOB: 1960/08/12 Today's Date: 07/13/2022   History of Present Illness 62 yo male s/p 07/13/22 R Reverse TSA PMH arthritis HTN   Clinical Impression   Patient is s/p R reverse TSA surgery resulting in functional limitations due to the deficits listed below (see OT problem list). 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 Pt currently with R UE block with only hand activation at the time of evaluation. Pt educated on entire post operative shoulder protocol. Patient will benefit from skilled OT acutely to increase independence and safety with ADLS to allow discharge home follow up with MD.       Recommendations for follow up therapy are one component of a multi-disciplinary discharge planning process, led by the attending physician.  Recommendations may be updated based on patient status, additional functional criteria and insurance authorization.   Follow Up Recommendations  Follow physician's recommendations for discharge plan and follow up therapies     Assistance Recommended at Discharge PRN  Patient can return home with the following Assist for transportation    Functional Status Assessment  Patient has had a recent decline in their functional status and demonstrates the ability to make significant improvements in function in a reasonable and predictable amount of time.  Equipment Recommendations  None recommended by OT    Recommendations for Other Services       Precautions / Restrictions Precautions Precautions: Shoulder Shoulder Interventions: Off for dressing/bathing/exercises Precaution Comments: handout provided for pendulums hand wrist elbow and post operative shoulder Required Braces or Orthoses: Sling Restrictions Weight Bearing Restrictions: Yes RUE Weight Bearing: Non weight bearing      Mobility Bed Mobility Overal bed mobility: Modified Independent                   Transfers Overall transfer level: Needs assistance   Transfers: Sit to/from Stand Sit to Stand: Supervision           General transfer comment: first time out of bed      Balance Overall balance assessment: No apparent balance deficits (not formally assessed)                                         ADL either performed or assessed with clinical judgement   ADL Overall ADL's : Needs assistance/impaired Eating/Feeding: Set up   Grooming: Oral care   Upper Body Bathing: Moderate assistance   Lower Body Bathing: Moderate assistance   Upper Body Dressing : Moderate assistance   Lower Body Dressing: Moderate assistance   Toilet Transfer: Min guard   Toileting- Clothing Manipulation and Hygiene: Minimal assistance       Functional mobility during ADLs: Min guard       Vision Baseline Vision/History: 1 Wears glasses Ability to See in Adequate Light: 0 Adequate Patient Visual Report: No change from baseline       Perception     Praxis      Pertinent Vitals/Pain Pain Assessment Pain Assessment: No/denies pain     Hand Dominance Right   Extremity/Trunk Assessment Upper Extremity Assessment Upper Extremity Assessment: RUE deficits/detail RUE Deficits / Details: post op   Lower Extremity Assessment Lower Extremity Assessment: Overall WFL for tasks assessed   Cervical / Trunk Assessment Cervical / Trunk Assessment: Normal   Communication Communication Communication: No difficulties   Cognition Arousal/Alertness: Awake/alert Behavior  During Therapy: WFL for tasks assessed/performed Overall Cognitive Status: Within Functional Limits for tasks assessed                                       General Comments  incision intact and dry at this time    Exercises Exercises: Other exercises Other Exercises Other Exercises: shoulder- pendulums PROM, activation of hand only at this time due to block. educated  on all hand wrist and elbow   Shoulder Instructions      Home Living Family/patient expects to be discharged to:: Private residence Living Arrangements: Spouse/significant other Available Help at Discharge: Available 24 hours/day;Family Type of Home: House Home Access: Stairs to enter Technical brewer of Steps: 4 Entrance Stairs-Rails: Right;Left Home Layout: Two level;1/2 bath on main level;Bed/bath upstairs (can stay in recliner on main level if needed) Alternate Level Stairs-Number of Steps: 12-15 Alternate Level Stairs-Rails: Left;Right Bathroom Shower/Tub: Occupational psychologist: Standard     Home Equipment: None   Additional Comments: wife will be with patient 24/7, dog boxer, youngest son 89 yo      Prior Functioning/Environment Prior Level of Function : Working/employed;Driving;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)                        OT Problem List: Decreased strength;Decreased activity tolerance;Impaired balance (sitting and/or standing);Decreased knowledge of use of DME or AE;Decreased knowledge of precautions;Impaired UE functional use      OT Treatment/Interventions: Self-care/ADL training;Therapeutic exercise;Energy conservation;DME and/or AE instruction;Manual therapy;Modalities;Therapeutic activities;Patient/family education;Balance training    OT Goals(Current goals can be found in the care plan section) Acute Rehab OT Goals Patient Stated Goal: to go home and to get FMLA forms signed OT Goal Formulation: With patient Time For Goal Achievement: 07/27/22 Potential to Achieve Goals: Good  OT Frequency: Min 2X/week    Co-evaluation              AM-PAC OT "6 Clicks" Daily Activity     Outcome Measure Help from another person eating meals?: A Little Help from another person taking care of personal grooming?: A Little Help from another person toileting, which includes using toliet, bedpan, or urinal?: A  Lot Help from another person bathing (including washing, rinsing, drying)?: A Lot Help from another person to put on and taking off regular upper body clothing?: A Little Help from another person to put on and taking off regular lower body clothing?: A Lot 6 Click Score: 15   End of Session Nurse Communication: Mobility status;Precautions;Weight bearing status  Activity Tolerance: Patient tolerated treatment well Patient left: in chair;with call bell/phone within reach;with family/visitor present  OT Visit Diagnosis: Unsteadiness on feet (R26.81);Muscle weakness (generalized) (M62.81)                Time: 6834-1962 OT Time Calculation (min): 37 min Charges:  OT General Charges $OT Visit: 1 Visit OT Evaluation $OT Eval Moderate Complexity: 1 Mod OT Treatments $Self Care/Home Management : 8-22 mins   Brynn, OTR/L  Acute Rehabilitation Services Office: [phone] .   Jeri Modena 07/13/2022, 3:50 PM

## 2022-07-13 NOTE — Progress Notes (Signed)
PT Cancellation Note  Patient Details Name: Alan Carr MRN: [id number] DOB: 01-Feb-1961   Cancelled Treatment:    Reason Eval/Treat Not Completed: PT screened, no needs identified, will sign off. Per discussion with OT, pt has no current acute PT needs. Pt's PT needs can be met in the outpatient setting. Acute PT signing off.   Zenaida Niece 07/13/2022, 4:57 PM

## 2022-07-14 DIAGNOSIS — M19011 Primary osteoarthritis, right shoulder: Secondary | ICD-10-CM | POA: Diagnosis not present

## 2022-07-14 MED ORDER — DOCUSATE SODIUM 100 MG PO CAPS: 100.0000 mg | ORAL_CAPSULE | Freq: Two times a day (BID) | ORAL | 0 refills | Status: AC

## 2022-07-14 MED ORDER — METHOCARBAMOL 500 MG PO TABS: 500.0000 mg | ORAL_TABLET | Freq: Three times a day (TID) | ORAL | 0 refills | Status: AC | PRN

## 2022-07-14 MED ORDER — OXYCODONE HCL 5 MG PO TABS: 5.0000 mg | ORAL_TABLET | ORAL | 0 refills | Status: AC | PRN

## 2022-07-14 MED ORDER — GABAPENTIN 100 MG PO CAPS: 100.0000 mg | ORAL_CAPSULE | Freq: Three times a day (TID) | ORAL | 0 refills | Status: AC

## 2022-07-14 NOTE — Progress Notes (Signed)
  Subjective: Alan Carr is a 62 y.o. male s/p right RSA.  They are POD1.  Pt's pain is controlled.  Patient denies any complaints of chest pain, shortness of breath, abdominal pain. Block still in effect to small degree  Objective: Vital signs in last 24 hours: Temp:  [97.1 F (36.2 C)-98.6 F (37 C)] 97.7 F (36.5 C) (02/07 0537) Pulse Rate:  [72-99] 74 (02/07 0740) Resp:  [13-17] 16 (02/07 0740) BP: (115-143)/(57-85) 128/77 (02/07 0740) SpO2:  [92 %-98 %] 96 % (02/07 0740)  Intake/Output from previous day: 02/06 0701 - 02/07 0700 In: 1832.5 [I.V.:1632.5; IV Piggyback:200] Out: 925 [Urine:825; Blood:100] Intake/Output this shift: No intake/output data recorded.  Exam:  No gross blood or drainage overlying the dressing 2+ radial pulse of the operative extremity Postoperative physical exam somewhat limited by interscalene block but intact EPL, FPL, finger abduction, bicep flexion, tricep extension, deltoid.  Axillary nerve intact   Labs: No results for input(s): "HGB" in the last 72 hours. No results for input(s): "WBC", "RBC", "HCT", "PLT" in the last 72 hours. No results for input(s): "NA", "K", "CL", "CO2", "BUN", "CREATININE", "GLUCOSE", "CALCIUM" in the last 72 hours. No results for input(s): "LABPT", "INR" in the last 72 hours.  Assessment/Plan: Pt is POD1 s/p right RSA    -Plan to discharge to home  -No lifting with the operative arm  -Stay in sling except for showering/sleeping and using CPM machine at home.  No lifting with the operative arm more than 1 to 2 pounds  -Follow-up with Dr. Marlou Sa in clinic 2 weeks postoperatively     [facility] 07/14/2022, 8:31 AM

## 2022-07-14 NOTE — Progress Notes (Signed)
Occupational Therapy Treatment Patient Details Name: Alan Carr MRN: [id number] DOB: 1960/07/07 Today's Date: 07/14/2022   History of present illness 62 yo male s/p 07/13/22 R Reverse TSA PMH arthritis HTN   OT comments  Pt and wife with good return demo of don doff sling, exercises program and positioning for chair ( recliner) at home. All questions answered. Pt is at adequate level for d/c home.    Recommendations for follow up therapy are one component of a multi-disciplinary discharge planning process, led by the attending physician.  Recommendations may be updated based on patient status, additional functional criteria and insurance authorization.    Follow Up Recommendations  Follow physician's recommendations for discharge plan and follow up therapies     Assistance Recommended at Discharge PRN  Patient can return home with the following  Assist for transportation   Equipment Recommendations  None recommended by OT    Recommendations for Other Services      Precautions / Restrictions Precautions Precautions: Shoulder Shoulder Interventions: Off for dressing/bathing/exercises Required Braces or Orthoses: Sling Restrictions Weight Bearing Restrictions: Yes RUE Weight Bearing: Non weight bearing       Mobility Bed Mobility                    Transfers Overall transfer level: Independent                       Balance                                           ADL either performed or assessed with clinical judgement   ADL Overall ADL's : Needs assistance/impaired                 Upper Body Dressing : Minimal assistance;Standing Upper Body Dressing Details (indicate cue type and reason): return demo of button down shirt. pt again advised that a size bigger could assist in making don doff easier initially Lower Body Dressing: With caregiver independent assisting                      Extremity/Trunk Assessment  Upper Extremity Assessment Upper Extremity Assessment: RUE deficits/detail RUE Deficits / Details: post op            Vision       Perception     Praxis      Cognition Arousal/Alertness: Awake/alert Behavior During Therapy: WFL for tasks assessed/performed Overall Cognitive Status: Within Functional Limits for tasks assessed                                          Exercises Exercises: Other exercises Other Exercises Other Exercises: shoulder- pendulums PROM, activation of hand only at this time due to block. educated on all hand wrist and elbow    Shoulder Instructions       General Comments      Pertinent Vitals/ Pain       Pain Assessment Pain Assessment: Faces Faces Pain Scale: Hurts little more Pain Location: shoulder incision site Pain Descriptors / Indicators: Sore Pain Intervention(s): Monitored during session, Premedicated before session, Repositioned  Home Living  Prior Functioning/Environment              Frequency  Min 2X/week        Progress Toward Goals  OT Goals(current goals can now be found in the care plan section)  Progress towards OT goals: Progressing toward goals  Acute Rehab OT Goals Patient Stated Goal: to go home today OT Goal Formulation: With patient Time For Goal Achievement: 07/27/22 Potential to Achieve Goals: Good ADL Goals Pt/caregiver will Perform Home Exercise Program: Right Upper extremity;Independently;With written HEP provided (met) Additional ADL Goal #1: pt will don doff sling mod I (met)  Plan Discharge plan remains appropriate    Co-evaluation                 AM-PAC OT "6 Clicks" Daily Activity     Outcome Measure   Help from another person eating meals?: A Little Help from another person taking care of personal grooming?: A Little Help from another person toileting, which includes using toliet, bedpan, or urinal?:  A Little Help from another person bathing (including washing, rinsing, drying)?: A Little Help from another person to put on and taking off regular upper body clothing?: A Little Help from another person to put on and taking off regular lower body clothing?: A Little 6 Click Score: 18    End of Session    OT Visit Diagnosis: Unsteadiness on feet (R26.81);Muscle weakness (generalized) (M62.81)   Activity Tolerance Patient tolerated treatment well   Patient Left in chair;with call bell/phone within reach;with family/visitor present   Nurse Communication Mobility status;Precautions;Weight bearing status        Time: 1610-9604 OT Time Calculation (min): 31 min  Charges: OT General Charges $OT Visit: 1 Visit OT Treatments $Self Care/Home Management : 23-37 mins   Brynn, OTR/L  Acute Rehabilitation Services Office: [phone] .   Jeri Modena 07/14/2022, 10:16 AM

## 2022-07-15 ENCOUNTER — Encounter (HOSPITAL_COMMUNITY): Payer: Self-pay | Admitting: Orthopedic Surgery

## 2022-07-18 DIAGNOSIS — M7521 Bicipital tendinitis, right shoulder: Secondary | ICD-10-CM

## 2022-07-18 DIAGNOSIS — M19011 Primary osteoarthritis, right shoulder: Secondary | ICD-10-CM

## 2022-07-20 ENCOUNTER — Telehealth: Payer: Self-pay | Admitting: Orthopedic Surgery

## 2022-07-21 NOTE — Telephone Encounter (Signed)
Patient did have an appt for Wednesday morning but stated he was never made aware of appt-I moved him to Monday afternoon and he is aware he  may need have to wait due to how full the schedule is.

## 2022-07-22 NOTE — Discharge Summary (Signed)
Physician Discharge Summary      Patient ID: Alan Carr MRN: [id number] DOB/AGE: 1960-09-13 62 y.o.  Admit date: 07/13/2022 Discharge date: 07/14/2022  Admission Diagnoses:  Principal Problem:   S/P reverse total shoulder arthroplasty, right Active Problems:   Arthritis of right shoulder region   Biceps tendonitis on right   Discharge Diagnoses:  Same  Surgeries: Procedure(s): RIGHT REVERSE SHOULDER ARTHROPLASTY on 07/13/2022   Consultants:   Discharged Condition: Stable  Hospital Course: Alan Carr is an 62 y.o. 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  They were brought to the operating room on 07/13/2022 and underwent the above named procedures.  Pt awoke from anesthesia without complication and was transferred to the floor. On POD1, patient's pain was well-controlled.  Block still in effect.  He had no red flag symptoms.  He was able to ambulate around his room without difficulty.  Worked well with occupational therapy.  He was discharged home on POD 1..  Pt will f/u with Dr. Marlou Sa in clinic in ~2 weeks.   Antibiotics given:  Anti-infectives (From admission, onward)    Start     Dose/Rate Route Frequency Ordered Stop   07/13/22 2000  ceFAZolin (ANCEF) IVPB 2g/100 mL premix  Status:  Discontinued        2 g 200 mL/hr over 30 Minutes Intravenous Every 8 hours 07/13/22 1339 07/14/22 1450   07/13/22 0828  vancomycin (VANCOCIN) powder  Status:  Discontinued          As needed 07/13/22 0828 07/13/22 1151   07/13/22 0600  ceFAZolin (ANCEF) IVPB 2g/100 mL premix        2 g 200 mL/hr over 30 Minutes Intravenous On call to O.R. 07/13/22 0545 07/13/22 1143   07/13/22 0552  ceFAZolin (ANCEF) 2-4 GM/100ML-% IVPB       Note to Pharmacy: Wendall Mola M: cabinet override      07/13/22 0552 07/13/22 0815     .  Recent vital signs:  Vitals:   07/14/22 0537 07/14/22 0740  BP: 124/83  128/77  Pulse: 78 74  Resp:  16  Temp: 97.7 F (36.5 C)   SpO2: 98% 96%    Recent laboratory studies:  Results for orders placed or performed during the hospital encounter of 07/06/22  Urine Culture   Specimen: Urine, Clean Catch  Result Value Ref Range   Specimen Description URINE, CLEAN CATCH    Special Requests NONE    Culture      NO GROWTH Performed at [facility], [place] [address] [place], [place] [address]    Report Status 07/07/2022 FINAL   Surgical pcr screen   Specimen: Nasal Mucosa; Nasal Swab  Result Value Ref Range   MRSA, PCR NEGATIVE NEGATIVE   Staphylococcus aureus NEGATIVE NEGATIVE  CBC  Result Value Ref Range   WBC 7.0 4.0 - 10.5 K/uL   RBC 4.73 4.22 - 5.81 MIL/uL   Hemoglobin 15.6 13.0 - 17.0 g/dL   HCT 44.5 39.0 - 52.0 %   MCV 94.1 80.0 - 100.0 fL   MCH 33.0 26.0 - 34.0 pg   MCHC 35.1 30.0 - 36.0 g/dL   RDW 12.0 11.5 - 15.5 %   Platelets 209 150 - 400 K/uL   nRBC 0.0 0.0 - 0.2 %  Basic metabolic panel  Result Value Ref Range   Sodium 137 135 - 145 mmol/L   Potassium  3.7 3.5 - 5.1 mmol/L   Chloride 103 98 - 111 mmol/L   CO2 22 22 - 32 mmol/L   Glucose, Bld 96 70 - 99 mg/dL   BUN 10 6 - 20 mg/dL   Creatinine, Ser 0.75 0.61 - 1.24 mg/dL   Calcium 9.2 8.9 - 10.3 mg/dL   GFR, Estimated >60 >60 mL/min   Anion gap 12 5 - 15  Urinalysis, Routine w reflex microscopic -  Result Value Ref Range   Color, Urine STRAW (A) YELLOW   APPearance CLEAR CLEAR   Specific Gravity, Urine 1.004 (L) 1.005 - 1.030   pH 6.0 5.0 - 8.0   Glucose, UA NEGATIVE NEGATIVE mg/dL   Hgb urine dipstick NEGATIVE NEGATIVE   Bilirubin Urine NEGATIVE NEGATIVE   Ketones, ur NEGATIVE NEGATIVE mg/dL   Protein, ur NEGATIVE NEGATIVE mg/dL   Nitrite NEGATIVE NEGATIVE   Leukocytes,Ua NEGATIVE NEGATIVE    Discharge Medications:   Allergies as of 07/14/2022       Reactions   Aspirin Anaphylaxis   Ibuprofen Swelling        Medication List     TAKE these medications     allopurinol 150 mg Tabs tablet Commonly known as: ZYLOPRIM Take 300 mg by mouth daily.   atorvastatin 40 MG tablet Commonly known as: LIPITOR Take 40 mg by mouth daily.   cetirizine 10 MG tablet Commonly known as: ZYRTEC Take 10 mg by mouth daily.   docusate sodium 100 MG capsule Commonly known as: COLACE Take 1 capsule (100 mg total) by mouth 2 (two) times daily.   gabapentin 100 MG capsule Commonly known as: Neurontin Take 1 capsule (100 mg total) by mouth 3 (three) times daily.   lisinopril 20 MG tablet Commonly known as: ZESTRIL Take 20 mg by mouth daily.   methocarbamol 500 MG tablet Commonly known as: ROBAXIN Take 1 tablet (500 mg total) by mouth every 8 (eight) hours as needed for muscle spasms.   oxyCODONE 5 MG immediate release tablet Commonly known as: Oxy IR/ROXICODONE Take 1 tablet (5 mg total) by mouth every 4 (four) hours as needed for moderate pain (pain score 4-6). Do not take with Ambien   tetrahydrozoline 0.05 % ophthalmic solution Place 1 drop into both eyes daily as needed (dry/irritated eyes).   zolpidem 10 MG tablet Commonly known as: AMBIEN Take 10 mg by mouth at bedtime as needed for sleep.        Diagnostic Studies: DG Shoulder Right Port  Result Date: 07/13/2022 CLINICAL DATA:  Right shoulder a placement. EXAM: RIGHT SHOULDER - 1 VIEW COMPARISON:  Preoperative imaging. FINDINGS: Reverse right shoulder arthroplasty in expected alignment. There is no periprosthetic lucency or fracture. Recent postsurgical change includes air and edema in the joint space and soft tissues. IMPRESSION: Reverse right shoulder arthroplasty without immediate postoperative complication. Electronically Signed   By: Keith Rake M.D.   On: 07/13/2022 12:33    Disposition: Discharge disposition: 01-Home or Self Care       Discharge Instructions     Call MD / Call 911   Complete by: As directed    If you experience chest pain or shortness of breath, CALL  911 and be transported to the hospital emergency room.  If you develope a fever above 101 F, pus (white drainage) or increased drainage or redness at the wound, or calf pain, call your surgeon's office.   Constipation Prevention   Complete by: As directed    Drink plenty of fluids.  Prune juice may be helpful.  You may use a stool softener, such as Colace (over the counter) 100 mg twice a day.  Use MiraLax (over the counter) for constipation as needed.   Diet - low sodium heart healthy   Complete by: As directed    Discharge instructions   Complete by: As directed    You may shower, dressing is waterproof.  Do not bathe or soak the operative shoulder in a tub, pool.  Use the CPM machine 3 times a day for one hour each time.  No lifting with the operative shoulder. Continue use of the sling.  Follow-up with Dr. Marlou Sa or Lurena Joiner in ~2 weeks on your given appointment date on 07/28/2022 at 10:15 AM.  We will remove your adhesive bandage at that time.  Call or send a MyChart message with any questions/concerns.   Dental Antibiotics:  In most cases prophylactic antibiotics for Dental procdeures after total joint surgery are not necessary.  Exceptions are as follows:  1. History of prior total joint infection  2. Severely immunocompromised (Organ Transplant, cancer chemotherapy, Rheumatoid biologic meds such as Edna)  3. Poorly controlled diabetes (A1C &gt; 8.0, blood glucose over 200)  If you have one of these conditions, contact your surgeon for an antibiotic prescription, prior to your dental procedure.   Increase activity slowly as tolerated   Complete by: As directed    Post-operative opioid taper instructions:   Complete by: As directed    POST-OPERATIVE OPIOID TAPER INSTRUCTIONS: It is important to wean off of your opioid medication as soon as possible. If you do not need pain medication after your surgery it is ok to stop day one. Opioids include: Codeine, Hydrocodone(Norco, Vicodin),  Oxycodone(Percocet, oxycontin) and hydromorphone amongst others.  Long term and even short term use of opiods can cause: Increased pain response Dependence Constipation Depression Respiratory depression And more.  Withdrawal symptoms can include Flu like symptoms Nausea, vomiting And more Techniques to manage these symptoms Hydrate well Eat regular healthy meals Stay active Use relaxation techniques(deep breathing, meditating, yoga) Do Not substitute Alcohol to help with tapering If you have been on opioids for less than two weeks and do not have pain than it is ok to stop all together.  Plan to wean off of opioids This plan should start within one week post op of your joint replacement. Maintain the same interval or time between taking each dose and first decrease the dose.  Cut the total daily intake of opioids by one tablet each day Next start to increase the time between doses. The last dose that should be eliminated is the evening dose.             SignedAnnie Main 07/22/2022, 4:07 PM

## 2022-07-26 ENCOUNTER — Encounter: Payer: BC Managed Care – PPO | Admitting: Orthopedic Surgery

## 2022-07-28 ENCOUNTER — Encounter: Payer: BC Managed Care – PPO | Admitting: Orthopedic Surgery

## 2022-07-28 ENCOUNTER — Ambulatory Visit (INDEPENDENT_AMBULATORY_CARE_PROVIDER_SITE_OTHER): Payer: BC Managed Care – PPO

## 2022-07-28 ENCOUNTER — Ambulatory Visit (INDEPENDENT_AMBULATORY_CARE_PROVIDER_SITE_OTHER): Payer: BC Managed Care – PPO | Admitting: Orthopedic Surgery

## 2022-07-28 ENCOUNTER — Encounter: Payer: Self-pay | Admitting: Orthopedic Surgery

## 2022-07-28 DIAGNOSIS — Z96611 Presence of right artificial shoulder joint: Secondary | ICD-10-CM | POA: Diagnosis not present

## 2022-07-28 NOTE — Progress Notes (Signed)
   Post-Op Visit Note   Patient: Alan Carr           Date of Birth: 1960/08/27           MRN: [id number] Visit Date: 07/28/2022 PCP: Derrill Center., MD   Assessment & Plan:  Chief Complaint:  Chief Complaint  Patient presents with   Right Shoulder - Follow-up, Routine Post Op    07/13/22 right RSA   Visit Diagnoses:  1. History of arthroplasty of right shoulder     Plan: Alan Carr is a 62 year old patient is now 2 weeks out right reverse shoulder replacement.  Taking Tylenol for pain.  On examination he has functional deltoid and improving range of motion.  Radiographs look good.  Plan is physical therapy at [facility].  He is okay to start back to sedentary work next Monday.  Come back in 4 weeks for clinical recheck.  Follow-Up Instructions: No follow-ups on file.   Orders:  Orders Placed This Encounter  Procedures   XR Shoulder Right   No orders of the defined types were placed in this encounter.   Imaging: XR Shoulder Right  Result Date: 07/28/2022 AP axillary outlet radiographs right shoulder reviewed.  Reverse shoulder prosthesis in good position alignment with no complicating features.   PMFS History: Patient Active Problem List   Diagnosis Date Noted   Arthritis of right shoulder region 07/18/2022   Biceps tendonitis on right 07/18/2022   S/P reverse total shoulder arthroplasty, right 07/13/2022   Past Medical History:  Diagnosis Date   Arthritis    right shoulder   Gout    Hypertension     History reviewed. No pertinent family history.  Past Surgical History:  Procedure Laterality Date   HERNIA REPAIR Right    inguinal   KNEE ARTHROSCOPY Left    meniscus repair   REVERSE SHOULDER ARTHROPLASTY Right 07/13/2022   Procedure: RIGHT REVERSE SHOULDER ARTHROPLASTY;  Surgeon: Meredith Pel, MD;  Location: [place];  Service: Orthopedics;  Laterality: Right;   WISDOM TOOTH EXTRACTION     Social History   Occupational History   Not on file  Tobacco  Use   Smoking status: Never   Smokeless tobacco: Never  Vaping Use   Vaping Use: Never used  Substance and Sexual Activity   Alcohol use: Yes    Alcohol/week: 2.0 - 4.0 standard drinks of alcohol    Types: 2 - 4 Standard drinks or equivalent per week   Drug use: No   Sexual activity: Not on file

## 2022-08-14 IMAGING — DX DG KNEE COMPLETE 4+V*R*
4 series · 4 of 4 positions shown · non-contrast
Comparison: None.

CLINICAL DATA: Twisting injury 1 day ago with knee pain, initial
encounter

EXAM:
RIGHT KNEE - COMPLETE 4+ VIEW

[knee ap]
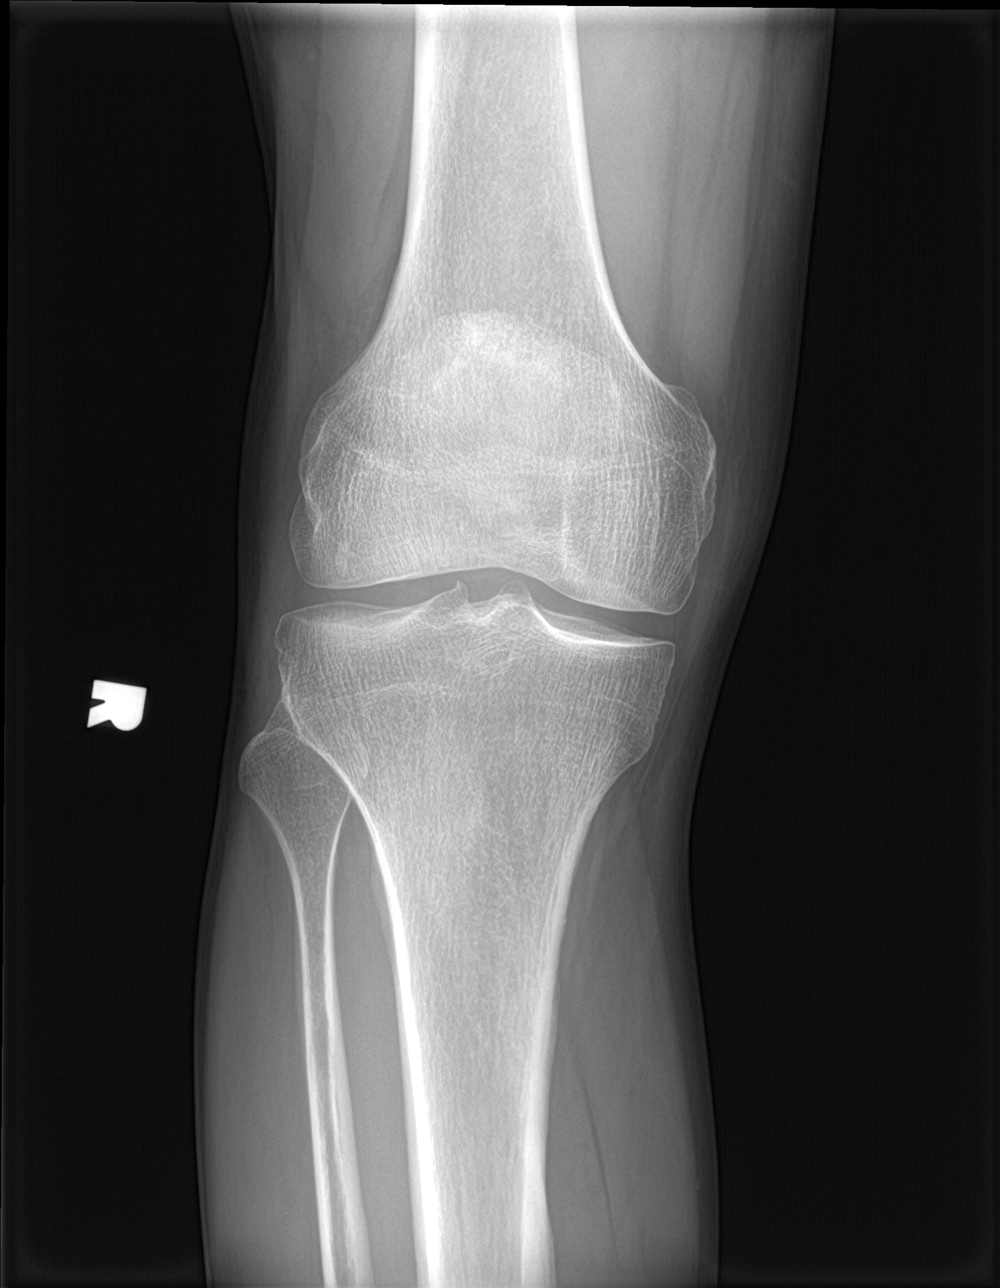

[knee lat]
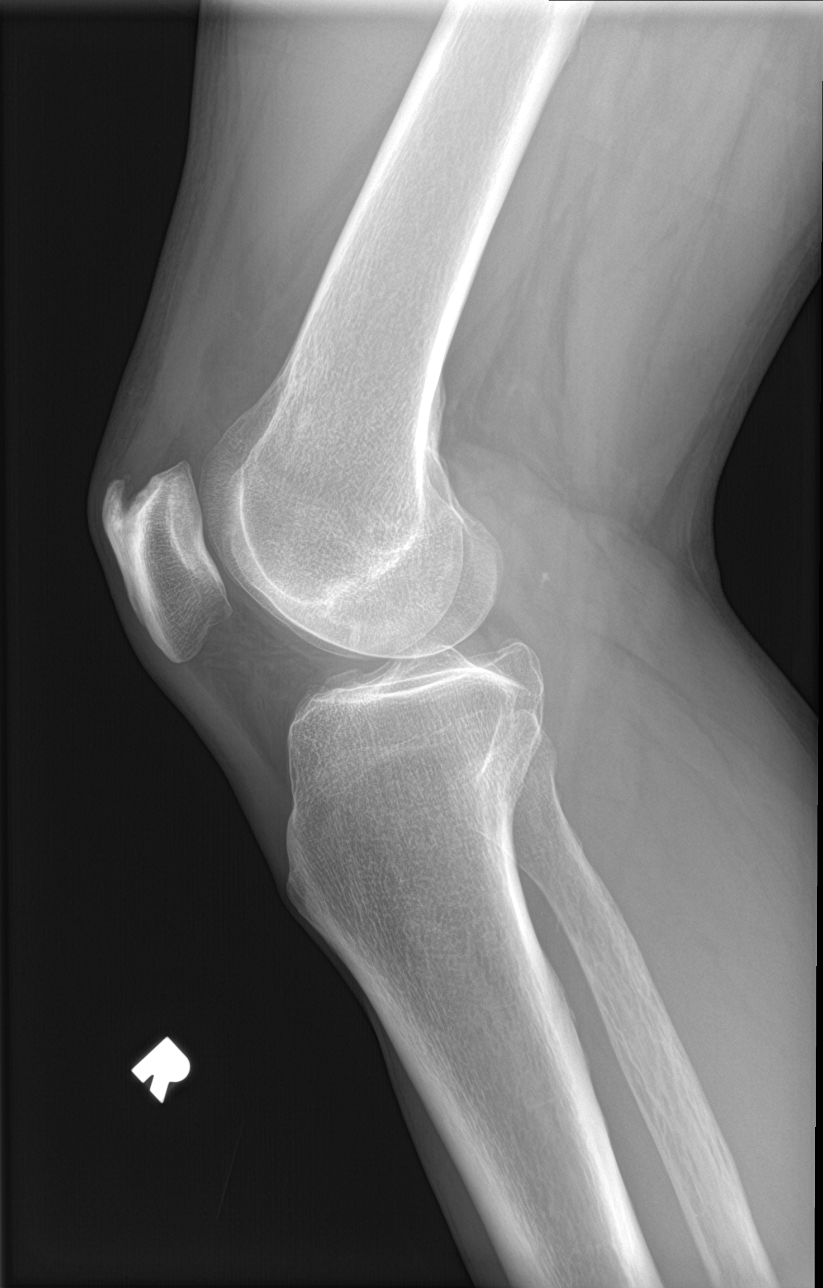

[knee obl (1 of 2)]
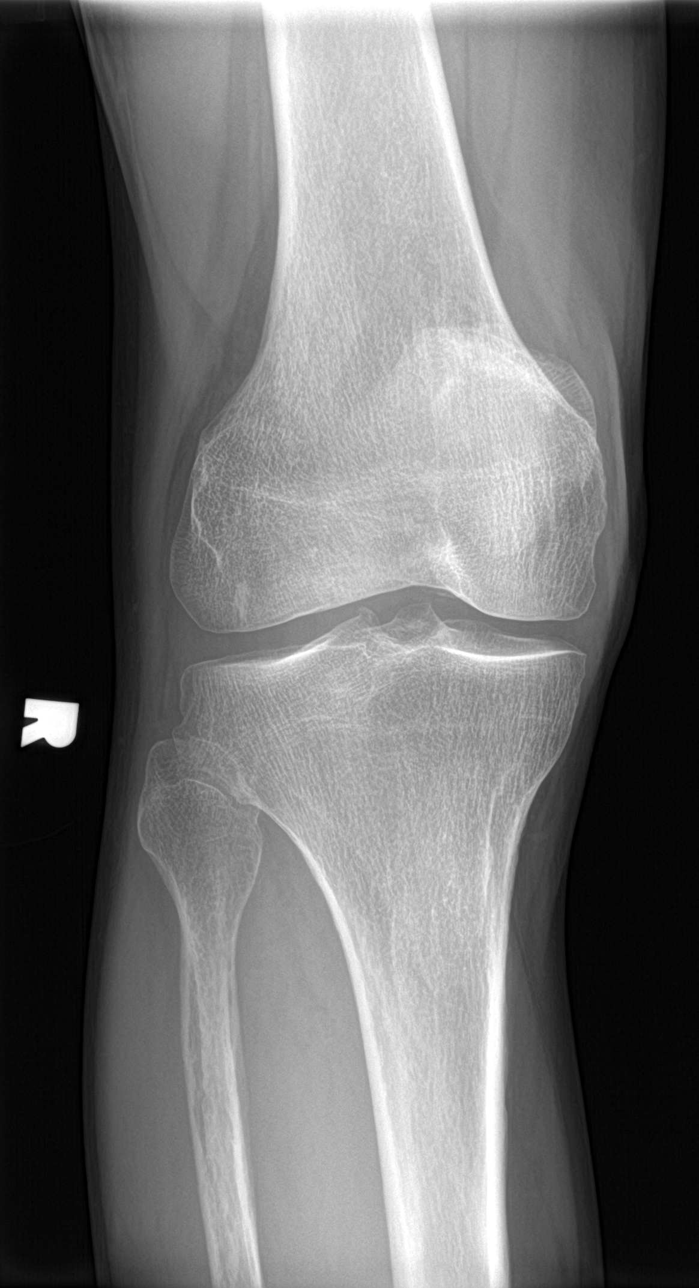

[knee obl (2 of 2)]
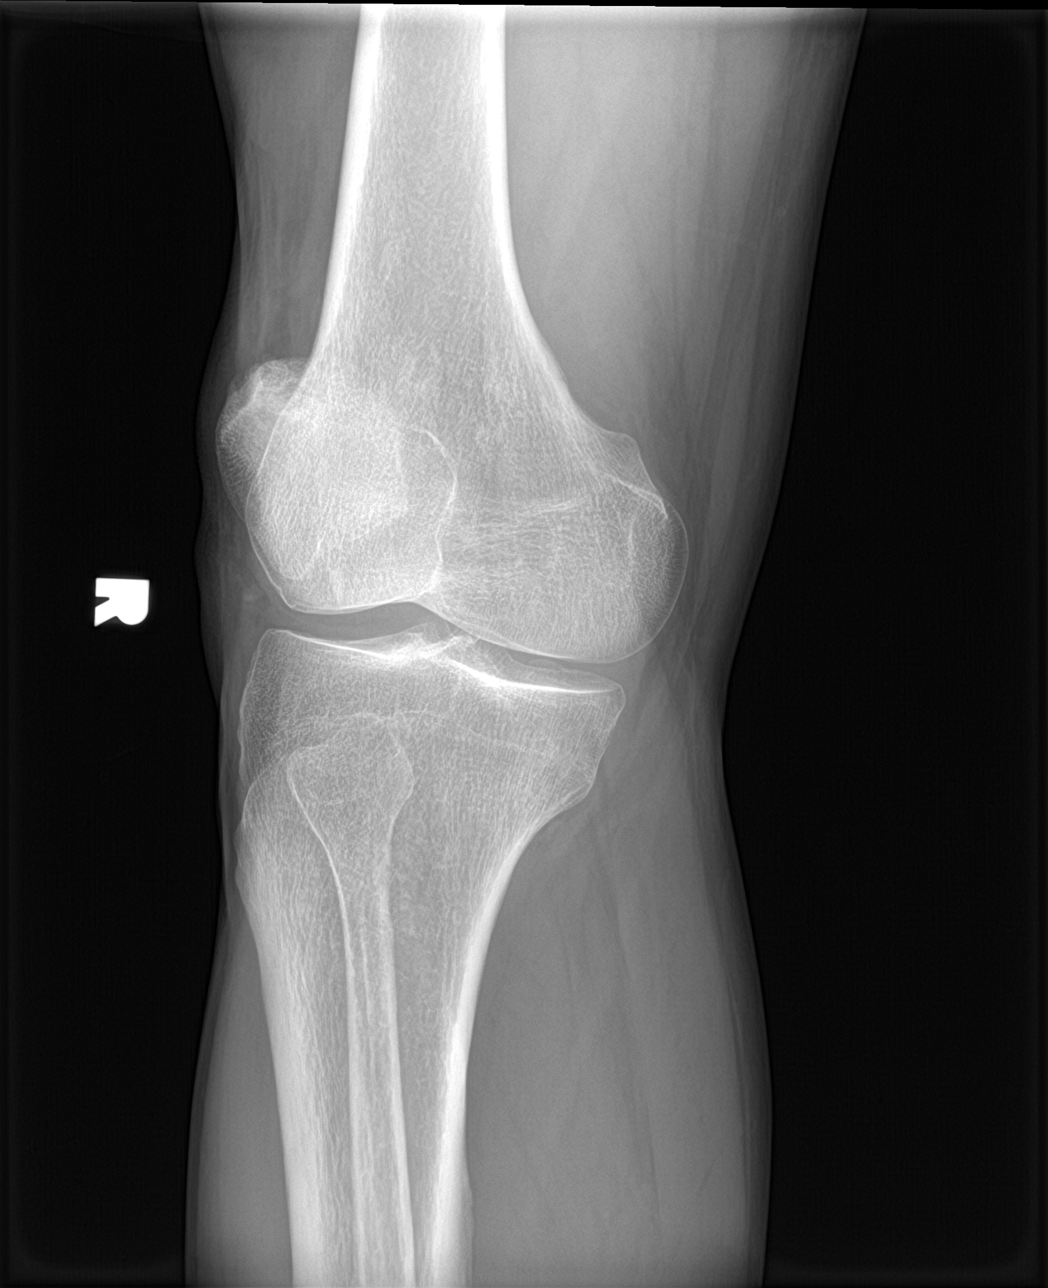

[4 of 4 positions shown; findings below may reference images not displayed]

FINDINGS: No acute fracture or dislocation is noted. No soft tissue
abnormality is seen. Mild spurring from the patella is noted.
IMPRESSION: No acute abnormality noted.

## 2022-08-18 ENCOUNTER — Ambulatory Visit (INDEPENDENT_AMBULATORY_CARE_PROVIDER_SITE_OTHER): Payer: BC Managed Care – PPO | Admitting: Surgical

## 2022-08-18 DIAGNOSIS — Z96611 Presence of right artificial shoulder joint: Secondary | ICD-10-CM

## 2022-08-19 ENCOUNTER — Encounter: Payer: Self-pay | Admitting: Orthopedic Surgery

## 2022-08-19 NOTE — Progress Notes (Signed)
Post-Op Visit Note   Patient: Alan Carr           Date of Birth: 1960/09/06           MRN: [id number] Visit Date: 08/18/2022 PCP: Derrill Center., MD   Assessment & Plan:  Chief Complaint:  Chief Complaint  Patient presents with   Right Shoulder - Follow-up    07/13/22 (5w 1d) Right Reverse Shoulder Arthroplasty      Visit Diagnoses:  1. History of arthroplasty of right shoulder     Plan: Patient is a 62 year old male who presents s/p right reverse shoulder arthroplasty on 07/13/2022.  Overall he is doing very well and he feels very satisfied with how his progress is coming along.  He has had 2 weeks of physical therapy 2 times per week and feels like this in particular has given him a lot of improvement.  Not taking any pain medication aside from occasional Tylenol.  He is able to sleep well at night.  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  Does also note some right elbow swelling since around the time of surgery he is not exactly sure when it started.  No fevers or chills.  No drainage from the elbow or from the incision.  On exam, patient has incision that is healing well without evidence of infection or dehiscence.  He has 2+ radial pulse of the operative extremity.  Intact EPL, FPL, finger abduction, finger abduction, pronation/supination, bicep, tricep, deltoid.  Axillary nerve is intact with deltoid firing.  He has excellent subscapularis strength rated 5/5.  Excellent supraspinatus strength rated 5/5.  5 -/5 external rotation strength.  Range of motion exam shows 20 degrees X rotation, 90 degrees abduction, 120 degrees forward elevation both passively and actively.  He has some bursitis of the olecranon with no erythema or tenderness.  There is some mild bone spurs that are palpable along the olecranon.  There is no disruption of the extensor mechanism of the elbow.  Plan is to continue with physical therapy.  He can lift  up to 10 pounds at this point.  Still remain on light duty for his work.  6-week return to see Dr. Marlou Sa.  Follow-Up Instructions: No follow-ups on file.   Orders:  No orders of the defined types were placed in this encounter.  No orders of the defined types were placed in this encounter.   Imaging: No results found.  PMFS History: Patient Active Problem List   Diagnosis Date Noted   Arthritis of right shoulder region 07/18/2022   Biceps tendonitis on right 07/18/2022   S/P reverse total shoulder arthroplasty, right 07/13/2022   Past Medical History:  Diagnosis Date   Arthritis    right shoulder   Gout    Hypertension     No family history on file.  Past Surgical History:  Procedure Laterality Date   HERNIA REPAIR Right    inguinal   KNEE ARTHROSCOPY Left    meniscus repair   REVERSE SHOULDER ARTHROPLASTY Right 07/13/2022   Procedure: RIGHT REVERSE SHOULDER ARTHROPLASTY;  Surgeon: Meredith Pel, MD;  Location: [place];  Service: Orthopedics;  Laterality: Right;   WISDOM TOOTH EXTRACTION     Social History   Occupational History   Not on file  Tobacco Use   Smoking status: Never   Smokeless tobacco: Never  Vaping Use   Vaping Use: Never used  Substance and Sexual  Activity   Alcohol use: Yes    Alcohol/week: 2.0 - 4.0 standard drinks of alcohol    Types: 2 - 4 Standard drinks or equivalent per week   Drug use: No   Sexual activity: Not on file

## 2022-10-06 ENCOUNTER — Ambulatory Visit (INDEPENDENT_AMBULATORY_CARE_PROVIDER_SITE_OTHER): Payer: BC Managed Care – PPO | Admitting: Orthopedic Surgery

## 2022-10-06 ENCOUNTER — Encounter: Payer: Self-pay | Admitting: Orthopedic Surgery

## 2022-10-06 DIAGNOSIS — Z96611 Presence of right artificial shoulder joint: Secondary | ICD-10-CM

## 2022-10-06 MED ORDER — AMOXICILLIN 500 MG PO TABS: ORAL_TABLET | ORAL | 0 refills | Status: AC

## 2022-10-06 NOTE — Progress Notes (Signed)
   Post-Op Visit Note   Patient: Alan Carr           Date of Birth: 01/23/1961           MRN: [id number] Visit Date: 10/06/2022 PCP: Elijio Miles., MD   Assessment & Plan:  Chief Complaint:  Chief Complaint  Patient presents with   Right Shoulder - Routine Post Op     right reverse shoulder arthroplasty on 07/13/2022.   Visit Diagnoses:  1. History of arthroplasty of right shoulder     Plan: Alan Carr is a 62 year old patient who is now almost 3 months out right reverse shoulder replacement.  Doing well.  Still working on strengthening.  In therapy but has been discharged.  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  She did a good job with [employer].  On examination his range of motion is 40/90/150.  Has very good subscap strength.  External rotation strength is about 5- out of 5.  Incision intact.  Has very good functional range of motion.  Plan at this time is note given to return to work with no restrictions.  Plan to send in amoxicillin for him for his tooth cleaning and June.  Should not really need to do antibiotics next year.  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  I do want him to be generally careful with the shoulder and not lift more than 20 pounds moving forward.  The shoulder really needs to last him for at least 20 years.  We will see him back as needed.  Follow-Up Instructions: No follow-ups on file.   Orders:  No orders of the defined types were placed in this encounter.  Meds ordered this encounter  Medications   amoxicillin (AMOXIL) 500 MG tablet    Sig: Take 2g 1 hour prior to dental procedure    Dispense:  10 tablet    Refill:  0    Imaging: No results found.  PMFS History: Patient Active Problem List   Diagnosis Date Noted   Arthritis of right shoulder region 07/18/2022   Biceps tendonitis on right 07/18/2022   S/P reverse total shoulder arthroplasty, right 07/13/2022   Past  Medical History:  Diagnosis Date   Arthritis    right shoulder   Gout    Hypertension     No family history on file.  Past Surgical History:  Procedure Laterality Date   HERNIA REPAIR Right    inguinal   KNEE ARTHROSCOPY Left    meniscus repair   REVERSE SHOULDER ARTHROPLASTY Right 07/13/2022   Procedure: RIGHT REVERSE SHOULDER ARTHROPLASTY;  Surgeon: Cammy Copa, MD;  Location: [facility];  Service: Orthopedics;  Laterality: Right;   WISDOM TOOTH EXTRACTION     Social History   Occupational History   Not on file  Tobacco Use   Smoking status: Never   Smokeless tobacco: Never  Vaping Use   Vaping Use: Never used  Substance and Sexual Activity   Alcohol use: Yes    Alcohol/week: 2.0 - 4.0 standard drinks of alcohol    Types: 2 - 4 Standard drinks or equivalent per week   Drug use: No   Sexual activity: Not on file

## 2023-03-28 ENCOUNTER — Encounter: Payer: BC Managed Care – PPO | Admitting: Orthopedic Surgery
